# Patient Record
Sex: Female | Born: 1935 | Race: White | Hispanic: No | Marital: Single | Smoking: Never smoker
Health system: Southern US, Community
[De-identification: ages and names within clinical notes are randomized; demographics above are authoritative.]

## PROBLEM LIST (undated history)

## (undated) DIAGNOSIS — I10 Essential (primary) hypertension: Secondary | ICD-10-CM

## (undated) DIAGNOSIS — I639 Cerebral infarction, unspecified: Secondary | ICD-10-CM

## (undated) DIAGNOSIS — E876 Hypokalemia: Secondary | ICD-10-CM

## (undated) DIAGNOSIS — E785 Hyperlipidemia, unspecified: Secondary | ICD-10-CM

## (undated) HISTORY — DX: Hyperlipidemia, unspecified: E78.5

## (undated) HISTORY — DX: Cerebral infarction, unspecified: I63.9

## (undated) HISTORY — DX: Hypokalemia: E87.6

## (undated) HISTORY — PX: ABDOMINAL HYSTERECTOMY: SHX81

## (undated) HISTORY — DX: Essential (primary) hypertension: I10

---

## 1998-11-27 ENCOUNTER — Other Ambulatory Visit: Admission: RE | Admit: 1998-11-27 | Discharge: 1998-11-27 | Payer: Self-pay | Admitting: *Deleted

## 1999-12-02 ENCOUNTER — Encounter: Admission: RE | Admit: 1999-12-02 | Discharge: 1999-12-02 | Payer: Self-pay | Admitting: *Deleted

## 1999-12-02 ENCOUNTER — Encounter: Payer: Self-pay | Admitting: *Deleted

## 2002-09-12 ENCOUNTER — Encounter: Payer: Self-pay | Admitting: *Deleted

## 2002-09-12 ENCOUNTER — Encounter: Admission: RE | Admit: 2002-09-12 | Discharge: 2002-09-12 | Payer: Self-pay | Admitting: *Deleted

## 2009-01-01 ENCOUNTER — Encounter: Admission: RE | Admit: 2009-01-01 | Discharge: 2009-01-01 | Payer: Self-pay | Admitting: Family Medicine

## 2009-07-23 ENCOUNTER — Encounter: Admission: RE | Admit: 2009-07-23 | Discharge: 2009-07-23 | Payer: Self-pay | Admitting: Family Medicine

## 2011-02-07 ENCOUNTER — Other Ambulatory Visit: Payer: Self-pay | Admitting: Family Medicine

## 2011-02-07 DIAGNOSIS — R42 Dizziness and giddiness: Secondary | ICD-10-CM

## 2011-02-12 ENCOUNTER — Ambulatory Visit
Admission: RE | Admit: 2011-02-12 | Discharge: 2011-02-12 | Disposition: A | Discharge status: Home / Self Care | Payer: 59 | Source: Ambulatory Visit | Attending: Family Medicine | Admitting: Family Medicine

## 2011-02-12 DIAGNOSIS — R42 Dizziness and giddiness: Secondary | ICD-10-CM

## 2011-02-12 MED ORDER — GADOBENATE DIMEGLUMINE 529 MG/ML IV SOLN
10.0000 mL | Freq: Once | INTRAVENOUS | Status: AC | PRN
  Administered 2011-02-12: 10 mL via INTRAVENOUS

## 2012-12-10 ENCOUNTER — Other Ambulatory Visit: Payer: Self-pay | Admitting: Family Medicine

## 2012-12-10 ENCOUNTER — Ambulatory Visit
Admission: RE | Admit: 2012-12-10 | Discharge: 2012-12-10 | Disposition: A | Discharge status: Home / Self Care | Payer: Medicare Other | Source: Ambulatory Visit | Attending: Family Medicine | Admitting: Family Medicine

## 2012-12-10 DIAGNOSIS — M79662 Pain in left lower leg: Secondary | ICD-10-CM

## 2013-05-16 ENCOUNTER — Other Ambulatory Visit (HOSPITAL_COMMUNITY): Payer: Self-pay | Admitting: Family Medicine

## 2013-05-16 DIAGNOSIS — R011 Cardiac murmur, unspecified: Secondary | ICD-10-CM

## 2013-05-28 ENCOUNTER — Ambulatory Visit (HOSPITAL_COMMUNITY)
Admission: RE | Admit: 2013-05-28 | Discharge: 2013-05-28 | Disposition: A | Discharge status: Home / Self Care | Payer: Medicare Other | Source: Ambulatory Visit | Attending: Internal Medicine | Admitting: Internal Medicine

## 2013-05-28 DIAGNOSIS — R011 Cardiac murmur, unspecified: Secondary | ICD-10-CM | POA: Insufficient documentation

## 2013-05-28 DIAGNOSIS — I359 Nonrheumatic aortic valve disorder, unspecified: Secondary | ICD-10-CM

## 2013-05-28 NOTE — Progress Notes (Signed)
2D Echo Performed 05/28/2013    Alexandra Mckee, RCS  

## 2013-07-30 ENCOUNTER — Institutional Professional Consult (permissible substitution): Payer: Medicare Other | Admitting: Internal Medicine

## 2013-08-01 ENCOUNTER — Emergency Department (HOSPITAL_COMMUNITY)
Admission: EM | Admit: 2013-08-01 | Discharge: 2013-08-01 | Disposition: A | Discharge status: Home / Self Care | Payer: Medicare Other | Attending: Emergency Medicine | Admitting: Emergency Medicine

## 2013-08-01 ENCOUNTER — Emergency Department (HOSPITAL_COMMUNITY): Payer: Medicare Other

## 2013-08-01 DIAGNOSIS — Z7982 Long term (current) use of aspirin: Secondary | ICD-10-CM | POA: Insufficient documentation

## 2013-08-01 DIAGNOSIS — E876 Hypokalemia: Secondary | ICD-10-CM | POA: Insufficient documentation

## 2013-08-01 DIAGNOSIS — F29 Unspecified psychosis not due to a substance or known physiological condition: Secondary | ICD-10-CM | POA: Insufficient documentation

## 2013-08-01 DIAGNOSIS — R4182 Altered mental status, unspecified: Secondary | ICD-10-CM | POA: Insufficient documentation

## 2013-08-01 DIAGNOSIS — IMO0002 Reserved for concepts with insufficient information to code with codable children: Secondary | ICD-10-CM | POA: Insufficient documentation

## 2013-08-01 DIAGNOSIS — Z79899 Other long term (current) drug therapy: Secondary | ICD-10-CM | POA: Insufficient documentation

## 2013-08-01 LAB — I-STAT TROPONIN, ED: Troponin i, poc: 0.02 ng/mL (ref 0.00–0.08)

## 2013-08-01 LAB — CBC WITH DIFFERENTIAL/PLATELET
Basophils Absolute: 0 10*3/uL (ref 0.0–0.1)
Basophils Relative: 0 % (ref 0–1)
EOS PCT: 1 % (ref 0–5)
Eosinophils Absolute: 0.1 10*3/uL (ref 0.0–0.7)
HEMATOCRIT: 37.2 % (ref 36.0–46.0)
Hemoglobin: 13 g/dL (ref 12.0–15.0)
LYMPHS ABS: 1.7 10*3/uL (ref 0.7–4.0)
LYMPHS PCT: 26 % (ref 12–46)
MCH: 31.2 pg (ref 26.0–34.0)
MCHC: 34.9 g/dL (ref 30.0–36.0)
MCV: 89.2 fL (ref 78.0–100.0)
MONO ABS: 0.7 10*3/uL (ref 0.1–1.0)
MONOS PCT: 10 % (ref 3–12)
Neutro Abs: 4.2 10*3/uL (ref 1.7–7.7)
Neutrophils Relative %: 63 % (ref 43–77)
Platelets: 221 10*3/uL (ref 150–400)
RBC: 4.17 MIL/uL (ref 3.87–5.11)
RDW: 12.2 % (ref 11.5–15.5)
WBC: 6.6 10*3/uL (ref 4.0–10.5)

## 2013-08-01 LAB — URINALYSIS, ROUTINE W REFLEX MICROSCOPIC
BILIRUBIN URINE: NEGATIVE
GLUCOSE, UA: NEGATIVE mg/dL
KETONES UR: NEGATIVE mg/dL
Nitrite: NEGATIVE
PH: 6.5 (ref 5.0–8.0)
Protein, ur: NEGATIVE mg/dL
Specific Gravity, Urine: 1.009 (ref 1.005–1.030)
Urobilinogen, UA: 1 mg/dL (ref 0.0–1.0)

## 2013-08-01 LAB — PROTIME-INR
INR: 0.85 (ref 0.00–1.49)
PROTHROMBIN TIME: 11.5 s — AB (ref 11.6–15.2)

## 2013-08-01 LAB — COMPREHENSIVE METABOLIC PANEL
ALT: 25 U/L (ref 0–35)
AST: 34 U/L (ref 0–37)
Albumin: 3.8 g/dL (ref 3.5–5.2)
Alkaline Phosphatase: 78 U/L (ref 39–117)
BUN: 18 mg/dL (ref 6–23)
CHLORIDE: 93 meq/L — AB (ref 96–112)
CO2: 30 meq/L (ref 19–32)
CREATININE: 0.73 mg/dL (ref 0.50–1.10)
Calcium: 9.9 mg/dL (ref 8.4–10.5)
GFR calc Af Amer: 85 mL/min — ABNORMAL LOW (ref >90)
GFR, EST NON AFRICAN AMERICAN: 73 mL/min — AB (ref >90)
Glucose, Bld: 170 mg/dL — ABNORMAL HIGH (ref 70–99)
Potassium: 2.8 mEq/L — CL (ref 3.7–5.3)
Sodium: 137 mEq/L (ref 137–147)
Total Protein: 7.2 g/dL (ref 6.0–8.3)

## 2013-08-01 LAB — PRO B NATRIURETIC PEPTIDE: Pro B Natriuretic peptide (BNP): 279.3 pg/mL (ref 0–450)

## 2013-08-01 LAB — LIPASE, BLOOD: LIPASE: 66 U/L — AB (ref 11–59)

## 2013-08-01 LAB — POTASSIUM: Potassium: 2.8 mEq/L — CL (ref 3.7–5.3)

## 2013-08-01 LAB — URINE MICROSCOPIC-ADD ON

## 2013-08-01 MED ORDER — SODIUM CHLORIDE 0.9 % IV SOLN: INTRAVENOUS | Status: DC

## 2013-08-01 MED ORDER — POTASSIUM CHLORIDE CRYS ER 20 MEQ PO TBCR
40.0000 meq | EXTENDED_RELEASE_TABLET | Freq: Once | ORAL | Status: AC
  Administered 2013-08-01: 40 meq via ORAL
  Filled 2013-08-01: qty 2

## 2013-08-01 MED ORDER — POTASSIUM CHLORIDE ER 10 MEQ PO TBCR: 10.0000 meq | EXTENDED_RELEASE_TABLET | Freq: Two times a day (BID) | ORAL | Status: AC

## 2013-08-01 MED ORDER — SODIUM CHLORIDE 0.9 % IV BOLUS (SEPSIS)
250.0000 mL | Freq: Once | INTRAVENOUS | Status: AC
  Administered 2013-08-01: 1000 mL via INTRAVENOUS

## 2013-08-01 NOTE — ED Notes (Signed)
Deretha Emory, MD aware of pt's critical potassium.

## 2013-08-01 NOTE — Discharge Instructions (Signed)
Hypokalemia Hypokalemia means that the amount of potassium in the blood is lower than normal.Potassium is a chemical, called an electrolyte, that helps regulate the amount of fluid in the body. It also stimulates muscle contraction and helps nerves function properly.Most of the body's potassium is inside of cells, and only a very small amount is in the blood. Because the amount in the blood is so small, minor changes can be life-threatening. CAUSES  Antibiotics.  Diarrhea or vomiting.  Using laxatives too much, which can cause diarrhea.  Chronic kidney disease.  Water pills (diuretics).  Eating disorders (bulimia).  Low magnesium level.  Sweating a lot. SIGNS AND SYMPTOMS  Weakness.  Constipation.  Fatigue.  Muscle cramps.  Mental confusion.  Skipped heartbeats or irregular heartbeat (palpitations).  Tingling or numbness. DIAGNOSIS  Your health care provider can diagnose hypokalemia with blood tests. In addition to checking your potassium level, your health care provider may also check other lab tests. TREATMENT Hypokalemia can be treated with potassium supplements taken by mouth or adjustments in your current medicines. If your potassium level is very low, you may need to get potassium through a vein (IV) and be monitored in the hospital. A diet high in potassium is also helpful. Foods high in potassium are:  Nuts, such as peanuts and pistachios.  Seeds, such as sunflower seeds and pumpkin seeds.  Peas, lentils, and lima beans.  Whole grain and bran cereals and breads.  Fresh fruit and vegetables, such as apricots, avocado, bananas, cantaloupe, kiwi, oranges, tomatoes, asparagus, and potatoes.  Orange and tomato juices.  Red meats.  Fruit yogurt. HOME CARE INSTRUCTIONS  Take all medicines as prescribed by your health care provider.  Maintain a healthy diet by including nutritious food, such as fruits, vegetables, nuts, whole grains, and lean meats.  If  you are taking a laxative, be sure to follow the directions on the label. SEEK MEDICAL CARE IF:  Your weakness gets worse.  You feel your heart pounding or racing.  You are vomiting or having diarrhea.  You are diabetic and having trouble keeping your blood glucose in the normal range. SEEK IMMEDIATE MEDICAL CARE IF:  You have chest pain, shortness of breath, or dizziness.  You are vomiting or having diarrhea for more than 2 days.  You faint. MAKE SURE YOU:   Understand these instructions.  Will watch your condition.  Will get help right away if you are not doing well or get worse. Document Released: 02/07/2005 Document Revised: 11/28/2012 Document Reviewed: 08/10/2012 [facility] Patient Information 2014 [place], [place].  Continue taking the potassium supplement as directed. Prescription provided. Would recommend followup with your record Dr. to have your potassium rechecked next week. Also verify that you're getting your nighttime medicine on a regular basis and we'll see if that provided some improvement in your mental status. If not additional medication or new medication may be required. Return for any new or worse symptoms.

## 2013-08-01 NOTE — ED Provider Notes (Signed)
CSN: [id number]     Arrival date & time 08/01/13  1638 History   First MD Initiated Contact with Patient 08/01/13 1654     Chief Complaint  Patient presents with  . Altered Mental Status     (Consider location/radiation/quality/duration/timing/severity/associated sxs/prior Treatment) Patient is a 80 y.o. female presenting with altered mental status. The history is provided by the patient.  Altered Mental Status Presenting symptoms: confusion   Associated symptoms: agitation   Associated symptoms: no abdominal pain, no fever, no headaches, no nausea, no rash, no vomiting and no weakness    Patient brought in by family. Patient lives by herself but daughter is nearby and checks on her frequently. They've noted a change in her mental status more of like when she wasn't taking her antidepressant. Patient not been as well patient not quite as alert patient rubbing her hands frequently. No fever no nausea vomiting no complaints of chest pain headache or belly pain. They think that she may have not been taking her antidepressant I started to give it to her all under fair control every evening so they know she's had for the last 3 days. Several things that happened in the past but she stopped taking it. Patient's only other medication is hydrochlorothiazide.  No past medical history on file. No past surgical history on file. No family history on file. History  Substance Use Topics  . Smoking status: Not on file  . Smokeless tobacco: Not on file  . Alcohol Use: Not on file   OB History   No data available     Review of Systems  Constitutional: Negative for fever.  HENT: Negative for congestion.   Eyes: Negative for visual disturbance.  Respiratory: Negative for shortness of breath.   Cardiovascular: Negative for chest pain.  Gastrointestinal: Negative for nausea, vomiting and abdominal pain.  Genitourinary: Negative for dysuria.  Musculoskeletal: Negative for back pain.  Skin: Negative  for rash.  Neurological: Negative for speech difficulty, weakness and headaches.  Hematological: Does not bruise/bleed easily.  Psychiatric/Behavioral: Positive for confusion and agitation.      Allergies  Review of patient's allergies indicates no known allergies.  Home Medications   Prior to Admission medications   Medication Sig Start Date End Date Taking? Authorizing Provider  aspirin 81 MG tablet Take 81 mg by mouth daily.   Yes Historical Provider, MD  hydrochlorothiazide (HYDRODIURIL) 25 MG tablet Take 25 mg by mouth daily.   Yes Historical Provider, MD  mirtazapine (REMERON) 15 MG tablet Take 15 mg by mouth at bedtime.   Yes Historical Provider, MD   BP 182/66  Pulse 85  Temp(Src) 98.1 F (36.7 C) (Oral)  Resp 13  SpO2 92% Physical Exam  Nursing note and vitals reviewed. Constitutional: She appears well-developed and well-nourished. No distress.  HENT:  Head: Normocephalic and atraumatic.  Mucous membranes slightly dry.  Eyes: Conjunctivae and EOM are normal. Pupils are equal, round, and reactive to light.  Cardiovascular: Normal rate, regular rhythm and normal heart sounds.   No murmur heard. Pulmonary/Chest: Effort normal and breath sounds normal. No respiratory distress.  Abdominal: Soft. Bowel sounds are normal. There is no tenderness.  Musculoskeletal: Normal range of motion. She exhibits no edema and no tenderness.  Neurological: She is alert. No cranial nerve deficit. She exhibits normal muscle tone. Coordination normal.  Skin: Skin is warm. No rash noted.    ED Course  Procedures (including critical care time) Labs Review Labs Reviewed  URINALYSIS, ROUTINE W REFLEX  MICROSCOPIC - Abnormal; Notable for the following:    APPearance CLOUDY (*)    Hgb urine dipstick SMALL (*)    Leukocytes, UA TRACE (*)    All other components within normal limits  COMPREHENSIVE METABOLIC PANEL - Abnormal; Notable for the following:    Potassium 2.8 (*)    Chloride 93  (*)    Glucose, Bld 170 (*)    Total Bilirubin <0.2 (*)    GFR calc non Af Amer 73 (*)    GFR calc Af Amer 85 (*)    All other components within normal limits  LIPASE, BLOOD - Abnormal; Notable for the following:    Lipase 66 (*)    All other components within normal limits  PROTIME-INR - Abnormal; Notable for the following:    Prothrombin Time 11.5 (*)    All other components within normal limits  POTASSIUM - Abnormal; Notable for the following:    Potassium 2.8 (*)    All other components within normal limits  CBC WITH DIFFERENTIAL  PRO B NATRIURETIC PEPTIDE  URINE MICROSCOPIC-ADD ON  Rosezena SensorI-STAT TROPOININ, ED    Imaging Review Dg Chest 2 View  08/01/2013   CLINICAL DATA:  Shortness of breath  EXAM: CHEST  2 VIEW  COMPARISON:  None.  FINDINGS: The heart size and mediastinal contours are within normal limits. Both lungs are clear. The visualized skeletal structures are unremarkable.  IMPRESSION: No active cardiopulmonary disease.   Electronically Signed   By: Alcide CleverMark  Lukens M.D.   On: 08/01/2013 18:46   Ct Head Wo Contrast  08/01/2013   CLINICAL DATA:  Altered mental status for 1 week. Atrial fibrillation.  EXAM: CT HEAD WITHOUT CONTRAST  TECHNIQUE: Contiguous axial images were obtained from the base of the skull through the vertex without contrast.  COMPARISON:  MR brain 02/12/2011.  FINDINGS: No evidence for acute infarction, hemorrhage, mass lesion, hydrocephalus, or extra-axial fluid. Mild atrophy appropriate for age. Moderately advanced chronic microvascular ischemic change. No CT signs of proximal vascular thrombosis. Vascular calcification. Calvarium intact. No sinus air-fluid level or mastoid inflammation. Chronic changes as described. Right cataract extraction.  IMPRESSION: No acute intracranial abnormality.  Similar appearance to prior MR.   Electronically Signed   By: Davonna BellingJohn  Curnes M.D.   On: 08/01/2013 19:21     EKG Interpretation   Date/Time:  Thursday August 01 2013 16:41:18  EDT Ventricular Rate:  93 PR Interval:  216 QRS Duration: 105 QT Interval:  388 QTC Calculation: 483 R Axis:   99 Text Interpretation:  Sinus rhythm Prolonged PR interval Consider left  ventricular hypertrophy Inferior infarct, age indeterminate Baseline  wander in lead(s) I II aVR No previous ECGs available Confirmed by  Sallye Lunz  MD, Jahayra Mazo [phone]([id number]) on 08/01/2013 5:11:25 PM      MDM   Final diagnoses:  Hypokalemia  Altered mental status    Patient's mental status is more just kind of a spacing out not wanting to be rubbing her hands together family noted this happen before when she stopped taking her antidepressant. They have not been able to verify that she was taking on a regular basis but they started to keep track of it for the past 3 days and no she's gotten that. Patient also on hydrochlorothiazide for her blood pressure. That's probably the cause of the hypokalemia. The patient potassium was 2.8. Patient given 40 mg of potassium here x2 in between potassium was rechecked just to make sure that that was accurate was still 2.8. Discussed  with hospitalist patient does have a primary care Dr. can be discharged home continuing potassium supplements.  Patient's renal function is normal. Patient's EKG had no acute changes. Rest the patient's workup was negative. No evidence urinary tract infection no evidence of pneumonia head CT without any acute changes. Patient alert cooperative here. We'll discharge home on potassium supplements. She'll followup with her regular doctor first part of next week. Family will verify that she gets her antidepressant. They will reassess and see if additional medication or new medication is required. They will return for any newer worse symptoms.    Vanetta Mulders, MD 08/01/13 2216

## 2013-08-01 NOTE — ED Notes (Signed)
Family noted patient had worensing altered mental status for one week. Brought in by ems, ekg in transporter was a fib no noted cardiac hx,

## 2013-08-28 ENCOUNTER — Encounter: Payer: Self-pay | Admitting: Neurology

## 2013-08-28 ENCOUNTER — Telehealth: Payer: Self-pay | Admitting: Neurology

## 2013-08-28 ENCOUNTER — Ambulatory Visit (INDEPENDENT_AMBULATORY_CARE_PROVIDER_SITE_OTHER): Payer: Medicare Other | Admitting: Neurology

## 2013-08-28 VITALS — BP 128/70 | HR 74 | Resp 16 | Ht 64.25 in | Wt 131.5 lb

## 2013-08-28 DIAGNOSIS — R413 Other amnesia: Secondary | ICD-10-CM

## 2013-08-28 MED ORDER — DONEPEZIL HCL 10 MG PO TABS: ORAL_TABLET | ORAL | Status: DC

## 2013-08-28 NOTE — Progress Notes (Signed)
NEUROLOGY CONSULTATION NOTE  Alexandra Mckee MRN: [id number] DOB: [birth year]  Referring provider: Dr. Tally Joe Primary care provider: Dr. Tally Joe  Reason for consult:  dementia  Dear Dr Azucena Cecil:  Thank you for your kind referral of Alexandra Mckee for consultation of the above symptoms. Although her history is well known to you, please allow me to reiterate it for the purpose of our medical record. The patient was accompanied to the clinic by her daughter and caregiver/family friend who also provides collateral information. Records and images were personally reviewed where available.  HISTORY OF PRESENT ILLNESS: This is a pleasant 80 year old right-handed woman with a history of hypertension, hyperlipidemia, prediabetes, presenting for evaluation of memory loss.  According to the patient, she feels her memory is "okay, with some memory problems."  Her daughter reports that they started to become concerned a year ago while they were on vacation and the patient called her to say that "she would not be there" when they got back.  She had lost 13lbs over 2 weeks and was diagnosed with depression and started on Remeron.  Her daughter's family came to live with her for 8-9 months, and noticed that the patient became dependent on her and would not do things by herself anymore.  She would ask them to prepare her food, and would need prompting to do things. They were concerned she would not initiate things anymore, when in the past she was very independent and did everything by herself.  Family moved out 2 months ago, and the patient has had a caregiver friend staying with her.  They are concerned because she can't make a decision for herself anymore. She would become scared that the water was not going to get hot for her bath, then once in the tub, she is afraid of getting out and falling.  She has to be instructed to go to the bathroom, and she states she can't go, even if she is already sitting on the  toilet.  She has always been a "worrier" but gets anxious more easily now. She gets very agitated when they would leave the house, asking where they are going.  She states she "just doesn't feel right, I don't understand what is happening."  She sleeps well at night, but continues to have daytime drowsiness taking naps during the day.  Her daughter does endorse that she asks the same questions repeatedly.  She started needing help with writing out her checks last year, and her daughter took this over 6 months ago after she paid a bill twice.  She was brought to the ER last month after she was noted to have confusion.  Her family came to see her outside the house with her pocketbook and keys, stating she was going somewhere else because she had not paid the homeowner fees. Her BP was noted to be elevated, she was brought to [facility] ER where CBC and CMP were unremarkable except for low potassium.  Urinalysis, chest xray, and head CT were unremarkable.   She denies any headaches, dizziness, diplopia, dysarthria, dysphagia, neck pain, focal numbness/tingling/weakness, bowel/bladder incontinence.  She has occasional back pain.  Her 2 sisters (ages 57 and 99) have been diagnosed with dementia.    I personally reviewed head CT without contrast done 08/01/13 which showed mild global atrophy and moderately advanced chronic microvascular ischemic changes bilaterally.  Laboratory Data: Lab Results  Component Value Date   WBC 6.6 08/01/2013   HGB 13.0 08/01/2013  HCT 37.2 08/01/2013   MCV 89.2 08/01/2013   PLT 221 08/01/2013     Chemistry      Component Value Date/Time   NA 137 08/01/2013 1740   K 2.8* 08/01/2013 2045   CL 93* 08/01/2013 1740   CO2 30 08/01/2013 1740   BUN 18 08/01/2013 1740   CREATININE 0.73 08/01/2013 1740      Component Value Date/Time   CALCIUM 9.9 08/01/2013 1740   ALKPHOS 78 08/01/2013 1740   AST 34 08/01/2013 1740   ALT 25 08/01/2013 1740   BILITOT <0.2* 08/01/2013 1740      PAST MEDICAL  HISTORY: Past Medical History  Diagnosis Date  . Hyperlipidemia   . Hypertension   . Hypokalemia     PAST SURGICAL HISTORY: Past Surgical History  Procedure Laterality Date  . Abdominal hysterectomy      MEDICATIONS: Current Outpatient Prescriptions on File Prior to Visit  Medication Sig Dispense Refill  . hydrochlorothiazide (HYDRODIURIL) 25 MG tablet Take 25 mg by mouth daily.      . mirtazapine (REMERON) 15 MG tablet Take 15 mg by mouth at bedtime.      . potassium chloride (K-DUR) 10 MEQ tablet Take 1 tablet (10 mEq total) by mouth 2 (two) times daily.  10 tablet  0  . aspirin 81 MG tablet Take 81 mg by mouth daily.       No current facility-administered medications on file prior to visit.    ALLERGIES: No Known Allergies  FAMILY HISTORY: Family History  Problem Relation Age of Onset  . Heart attack Father     SOCIAL HISTORY: History   Social History  . Marital Status: Single    Spouse Name: N/A    Number of Children: N/A  . Years of Education: N/A   Occupational History  . Not on file.   Social History Main Topics  . Smoking status: Never Smoker   . Smokeless tobacco: Not on file  . Alcohol Use: No  . Drug Use: No  . Sexual Activity: Not on file   Other Topics Concern  . Not on file   Social History Narrative  . No narrative on file    REVIEW OF SYSTEMS: Constitutional: No fevers, chills, or sweats, no generalized fatigue, change in appetite Eyes: No visual changes, double vision, eye pain Ear, nose and throat: No hearing loss, ear pain, nasal congestion, sore throat Cardiovascular: No chest pain, palpitations Respiratory:  No shortness of breath at rest or with exertion, wheezes GastrointestinaI: No nausea, vomiting, diarrhea, abdominal pain, fecal incontinence Genitourinary:  No dysuria, urinary retention or frequency Musculoskeletal:  No neck pain, occl back pain Integumentary: No rash, pruritus, skin lesions Neurological: as  above Psychiatric: + depression, no insomnia,+ anxiety Endocrine: No palpitations, fatigue, diaphoresis, mood swings, change in appetite, change in weight, increased thirst Hematologic/Lymphatic:  No anemia, purpura, petechiae. Allergic/Immunologic: no itchy/runny eyes, nasal congestion, recent allergic reactions, rashes  PHYSICAL EXAM: Filed Vitals:   08/28/13 1034  BP: 128/70  Pulse: 74  Resp: 16   General: No acute distress Head:  Normocephalic/atraumatic Eyes: Fundoscopic exam shows bilateral sharp discs, no vessel changes, exudates, or hemorrhages Neck: supple, no paraspinal tenderness, full range of motion Back: No paraspinal tenderness Heart: regular rate and rhythm Lungs: Clear to auscultation bilaterally. Vascular: No carotid bruits. Skin/Extremities: No rash, no edema Neurological Exam: Mental status: alert and oriented to person, place, and time, no dysarthria or aphasia, Fund of knowledge is appropriate.  Remote memory are  intact.  Attention and concentration are normal.    Able to name objects and repeat phrases. MMSE 26/30 (missed 1 point for date, 0/3 delayed recall) Cranial nerves: CN I: not tested CN II: pupils equal, round and reactive to light, visual fields intact, fundi unremarkable. CN III, IV, VI:  full range of motion, no nystagmus, no ptosis CN V: facial sensation intact CN VII: upper and lower face symmetric CN VIII: hearing intact to finger rub CN IX, X: gag intact, uvula midline CN XI: sternocleidomastoid and trapezius muscles intact CN XII: tongue midline Bulk & Tone: normal, no fasciculations. Motor: 5/5 throughout with no pronator drift. Sensation: intact to light touch, cold, pin, vibration and joint position sense.  No extinction to double simultaneous stimulation.  Romberg test negative Deep Tendon Reflexes: +2 throughout except for absent ankle jerks bilaterally, no ankle clonus Plantar responses: downgoing bilaterally Cerebellar: no  incoordination on finger to nose, heel to shin. No dysdiadochokinesia Gait: narrow-based and steady, able to tandem walk adequately. Tremor: none  IMPRESSION: This is a pleasant 80 year old right-handed woman with a history of hypertension, hyperlipidemia, prediabetes, presenting with memory loss with some personality changes with increasing anxiety and agitation when asked to go to an unfamiliar environment.  Her family reports that she has become very dependent and unsure of herself.  MMSE 26/30, suggestive of mild cognitive impairment.  Her neurological exam is non-focal, head CT shows moderately advanced chronic microvascular disease, raising the possibility of vascular dementia. Depression commonly coexists, and may play a role as well.  She may benefit from starting cholinesterase inhibitors such as Aricept.  Side effects and expectations from the medication were discussed. We discussed the importance of monitoring mood, physical and brain stimulation exercises, and control of vascular risk factors for brain health.  We discussed the importance of long-term planning, family is looking into assisted living facilities, which I agree with.  She will follow-up in 6 months.  Thank you for allowing me to participate in the care of this patient. Please do not hesitate to call for any questions or concerns.   Patrcia DollyKaren Gillie Crisci, M.D.  CC: Dr. Azucena CecilSwayne

## 2013-08-28 NOTE — Telephone Encounter (Signed)
Can you pls check on generic donepezil?  THanks

## 2013-08-28 NOTE — Patient Instructions (Signed)
1. Start Aricept 10mg : Take 1/2 tab daily for 1 week, then increase to 1 tab daily 2. Physical and brain stimulation exercises are important for brain health 3. Agree with assisted living facility 4. Follow-up in 6 months

## 2013-08-28 NOTE — Telephone Encounter (Signed)
Pt states the ins will not pay for the medication and it was 240 dollars can you call the and find out what is approved by the ins or call in something else please daughters telephone number is [phone]

## 2013-08-28 NOTE — Telephone Encounter (Signed)
Is there something else she can take?

## 2013-08-29 NOTE — Telephone Encounter (Signed)
I called the pharmacy. The rx was ran under donepezil. The out of pocket price for the patient is $219.99. Is there a different med that you would like for me to check the price on for her?

## 2013-08-30 NOTE — Telephone Encounter (Signed)
After checking on the other 2 medications that were suggested as well as Namenda through Alexandra Mckee's pharmacy, which neither of these medications were covered when the pharmacy ran rx's through Alexandra Mckee's insurance. I also called [employer] medicare to see if I could get a list of drugs that would be covered for Alexandra Mckee's dx. I was informed that since Alexandra Mckee has medicare part b that she doesn't have a rx drug plan.   I did call Alexandra Mckee's daughter/Alexandra Mckee and made her aware of all of this. She wanted to know if Dr. Karel JarvisAquino had any other suggestions and if this medicine was something she felt like her mother had to be on. I told her I would speak with Dr. Karel JarvisAquino and call her back.  I spoke with Dr. Karel JarvisAquino about this and she states that this med isn't mandatory and that there is nothing we could do at this point since the medications aren't covered and are too expensive.  I called Alexandra Mckee back and relayed this information to her and she said if this is something that could possibly help her mother she would see if they could work something out. She is going to talk with her brother to see what his thoughts and if they decided to go with the medication she would call back to let Alexandra Mckee know.

## 2013-08-30 NOTE — Telephone Encounter (Signed)
Can you pls check on galantamine and rivastigmine? Thanks!

## 2013-08-31 ENCOUNTER — Encounter: Payer: Self-pay | Admitting: Neurology

## 2013-09-06 ENCOUNTER — Other Ambulatory Visit: Payer: Self-pay | Admitting: Infectious Disease

## 2013-09-06 ENCOUNTER — Ambulatory Visit
Admission: RE | Admit: 2013-09-06 | Discharge: 2013-09-06 | Disposition: A | Discharge status: Home / Self Care | Payer: No Typology Code available for payment source | Source: Ambulatory Visit | Attending: Infectious Disease | Admitting: Infectious Disease

## 2013-09-06 DIAGNOSIS — R7611 Nonspecific reaction to tuberculin skin test without active tuberculosis: Secondary | ICD-10-CM

## 2013-12-04 ENCOUNTER — Telehealth: Payer: Self-pay | Admitting: Neurology

## 2013-12-04 NOTE — Telephone Encounter (Signed)
appt canc per daughter's request, pt says she does not want to go further w/ med treatment at this time / Sherri S.

## 2013-12-23 ENCOUNTER — Encounter (HOSPITAL_COMMUNITY): Payer: Self-pay | Admitting: Emergency Medicine

## 2013-12-23 ENCOUNTER — Emergency Department (HOSPITAL_COMMUNITY)
Admission: EM | Admit: 2013-12-23 | Discharge: 2013-12-23 | Disposition: A | Discharge status: Home / Self Care | Payer: Medicare Other | Attending: Emergency Medicine | Admitting: Emergency Medicine

## 2013-12-23 DIAGNOSIS — Z8639 Personal history of other endocrine, nutritional and metabolic disease: Secondary | ICD-10-CM | POA: Insufficient documentation

## 2013-12-23 DIAGNOSIS — Z792 Long term (current) use of antibiotics: Secondary | ICD-10-CM | POA: Insufficient documentation

## 2013-12-23 DIAGNOSIS — I10 Essential (primary) hypertension: Secondary | ICD-10-CM | POA: Insufficient documentation

## 2013-12-23 DIAGNOSIS — Z7982 Long term (current) use of aspirin: Secondary | ICD-10-CM | POA: Diagnosis not present

## 2013-12-23 DIAGNOSIS — Z79899 Other long term (current) drug therapy: Secondary | ICD-10-CM | POA: Insufficient documentation

## 2013-12-23 DIAGNOSIS — R630 Anorexia: Secondary | ICD-10-CM | POA: Diagnosis present

## 2013-12-23 DIAGNOSIS — N390 Urinary tract infection, site not specified: Secondary | ICD-10-CM

## 2013-12-23 LAB — URINALYSIS, ROUTINE W REFLEX MICROSCOPIC
BILIRUBIN URINE: NEGATIVE
GLUCOSE, UA: NEGATIVE mg/dL
KETONES UR: NEGATIVE mg/dL
Nitrite: NEGATIVE
PROTEIN: NEGATIVE mg/dL
Specific Gravity, Urine: 1.023 (ref 1.005–1.030)
Urobilinogen, UA: 1 mg/dL (ref 0.0–1.0)
pH: 6 (ref 5.0–8.0)

## 2013-12-23 LAB — BASIC METABOLIC PANEL
Anion gap: 14 (ref 5–15)
BUN: 21 mg/dL (ref 6–23)
CHLORIDE: 106 meq/L (ref 96–112)
CO2: 25 meq/L (ref 19–32)
CREATININE: 0.76 mg/dL (ref 0.50–1.10)
Calcium: 9.4 mg/dL (ref 8.4–10.5)
GFR calc Af Amer: 84 mL/min — ABNORMAL LOW (ref >90)
GFR calc non Af Amer: 72 mL/min — ABNORMAL LOW (ref >90)
Glucose, Bld: 103 mg/dL — ABNORMAL HIGH (ref 70–99)
Potassium: 3.8 mEq/L (ref 3.7–5.3)
Sodium: 145 mEq/L (ref 137–147)

## 2013-12-23 LAB — CBC WITH DIFFERENTIAL/PLATELET
BASOS ABS: 0 10*3/uL (ref 0.0–0.1)
BASOS PCT: 0 % (ref 0–1)
EOS ABS: 0.1 10*3/uL (ref 0.0–0.7)
EOS PCT: 1 % (ref 0–5)
HEMATOCRIT: 38.9 % (ref 36.0–46.0)
HEMOGLOBIN: 13 g/dL (ref 12.0–15.0)
Lymphocytes Relative: 27 % (ref 12–46)
Lymphs Abs: 2 10*3/uL (ref 0.7–4.0)
MCH: 30.7 pg (ref 26.0–34.0)
MCHC: 33.4 g/dL (ref 30.0–36.0)
MCV: 91.7 fL (ref 78.0–100.0)
MONO ABS: 0.6 10*3/uL (ref 0.1–1.0)
MONOS PCT: 9 % (ref 3–12)
Neutro Abs: 4.6 10*3/uL (ref 1.7–7.7)
Neutrophils Relative %: 63 % (ref 43–77)
Platelets: 225 10*3/uL (ref 150–400)
RBC: 4.24 MIL/uL (ref 3.87–5.11)
RDW: 12.2 % (ref 11.5–15.5)
WBC: 7.3 10*3/uL (ref 4.0–10.5)

## 2013-12-23 LAB — URINE MICROSCOPIC-ADD ON

## 2013-12-23 MED ORDER — FOSFOMYCIN TROMETHAMINE 3 G PO PACK
3.0000 g | PACK | Freq: Once | ORAL | Status: AC
  Administered 2013-12-23: 3 g via ORAL
  Filled 2013-12-23 (×2): qty 3

## 2013-12-23 NOTE — ED Notes (Signed)
MD at bedside. 

## 2013-12-23 NOTE — ED Provider Notes (Signed)
CSN: [id number]     Arrival date & time 12/23/13  1327 History   First MD Initiated Contact with Patient 12/23/13 1344     Chief Complaint  Patient presents with  . Anorexia  . Fatigue     (Consider location/radiation/quality/duration/timing/severity/associated sxs/prior Treatment) HPI Patient presents with her daughter who provides the history of present illness.  Patient has dementia.  Level V caveat. Per report the patient was diagnosed with urinary tract infection 1 week ago.  Subsequently, the patient has had anorexia, decreased interactivity, increasing somnolence. No report of new fever, vomiting, falling. The patient herself denies pain, acknowledges feeling poorly, otherwise is essentially non-participatory in the exam.  Past Medical History  Diagnosis Date  . Hyperlipidemia   . Hypertension   . Hypokalemia    Past Surgical History  Procedure Laterality Date  . Abdominal hysterectomy     Family History  Problem Relation Age of Onset  . Heart attack Father    History  Substance Use Topics  . Smoking status: Never Smoker   . Smokeless tobacco: Not on file  . Alcohol Use: No   OB History    No data available     Review of Systems  Unable to perform ROS: Dementia      Allergies  Review of patient's allergies indicates no known allergies.  Home Medications   Prior to Admission medications   Medication Sig Start Date End Date Taking? Authorizing Provider  ciprofloxacin (CIPRO) 250 MG tablet Take 250 mg by mouth 2 (two) times daily.   Yes Historical Provider, MD  hydrochlorothiazide (HYDRODIURIL) 25 MG tablet Take 25 mg by mouth daily.   Yes Historical Provider, MD  mirtazapine (REMERON) 15 MG tablet Take 15 mg by mouth at bedtime.   Yes Historical Provider, MD  potassium chloride (K-DUR) 10 MEQ tablet Take 1 tablet (10 mEq total) by mouth 2 (two) times daily. 08/01/13  Yes Vanetta MuldersScott Zackowski, MD  aspirin 81 MG tablet Take 81 mg by mouth daily.    Historical  Provider, MD  donepezil (ARICEPT) 10 MG tablet Take 1/2 tab daily for 1 week, then increase to 1 tab daily 08/28/13   Van ClinesKaren M Aquino, MD  Multiple Vitamins-Minerals (MULTIVITAMIN WITH MINERALS) tablet Take 1 tablet by mouth daily.    Historical Provider, MD   BP 177/74 mmHg  Pulse 78  SpO2 97% Physical Exam  Constitutional: She is oriented to person, place, and time. She appears well-developed and well-nourished.  Non-toxic appearance. No distress.  HENT:  Head: Normocephalic and atraumatic.  Eyes: Conjunctivae and EOM are normal.  Cardiovascular: Normal rate and regular rhythm.   Pulmonary/Chest: Effort normal and breath sounds normal. No stridor. No respiratory distress.  Abdominal: She exhibits no distension.  Musculoskeletal: She exhibits no edema.  Neurological: She is alert and oriented to person, place, and time. No cranial nerve deficit.  Skin: Skin is warm and dry.  Psychiatric: She has a normal mood and affect.  Nursing note and vitals reviewed.   ED Course  Procedures (including critical care time) Labs Review Labs Reviewed  URINALYSIS, ROUTINE W REFLEX MICROSCOPIC - Abnormal; Notable for the following:    APPearance CLOUDY (*)    Hgb urine dipstick SMALL (*)    Leukocytes, UA MODERATE (*)    All other components within normal limits  BASIC METABOLIC PANEL - Abnormal; Notable for the following:    Glucose, Bld 103 (*)    GFR calc non Af Amer 72 (*)    GFR  calc Af Amer 84 (*)    All other components within normal limits  URINE MICROSCOPIC-ADD ON - Abnormal; Notable for the following:    Squamous Epithelial / LPF FEW (*)    All other components within normal limits  CBC WITH DIFFERENTIAL   I reviewed the EMR  Update: Patient in no distress  MDM  Patient presents with family members relate the history of present illness. Patient has recently diagnosed urinary tract infection, and currently family is concerned about decreased by mouth intake, decreased  interactivity. Patient's labs are largely reassuring, with no evidence for substantial electrolyte abnormalities, nor sepsis.    Gerhard Munchobert Demarlo Riojas, MD 12/23/13 [phone]

## 2013-12-23 NOTE — Discharge Instructions (Signed)
As discussed, your evaluation today has been largely reassuring.  But, it is important that you monitor your condition carefully, and do not hesitate to return to the ED if you develop new, or concerning changes in your condition. ? ?Otherwise, please follow-up with your physician for appropriate ongoing care. ? ?

## 2013-12-23 NOTE — ED Notes (Signed)
Pt's daughter states that [facility] and has been recently treated for UTI. Pt has had loss of appetite and not wanting to get out of bed.  Pt also been more anxious.  Pt is normally very active and going to all the activities at the facility.

## 2013-12-24 LAB — URINE CULTURE: SPECIAL REQUESTS: NORMAL

## 2014-02-28 ENCOUNTER — Ambulatory Visit: Payer: Medicare Other | Admitting: Neurology

## 2014-10-23 IMAGING — CT CT HEAD W/O CM
1 series · 16 of 30 positions shown, 20 images · non-contrast
Comparison: MR brain 02/12/2011.

CLINICAL DATA: Altered mental status for 1 week. Atrial
fibrillation.

EXAM:
CT HEAD WITHOUT CONTRAST
TECHNIQUE: Contiguous axial images were obtained from the base of the skull
through the vertex without contrast.

[Series 2: head 5.0 h30s · axial · 0.44mm/px · z∈[+1324,+1469]mm · 16 of 33 slices shown, 20 images]
[im 2/33  brain]
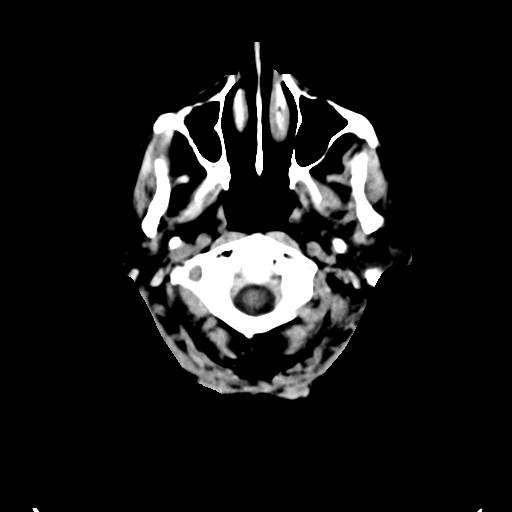
[im 2/33  bone]
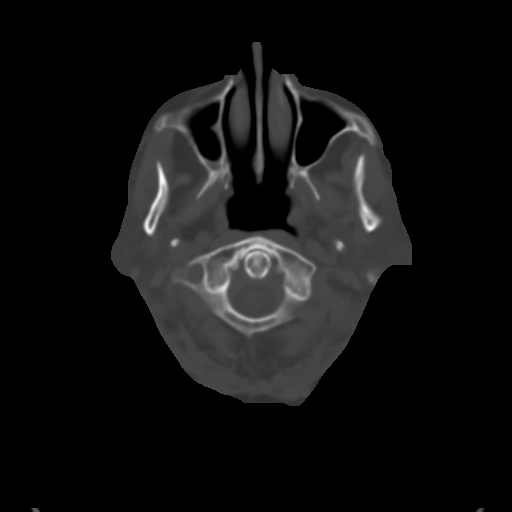
[im 4/33  brain]
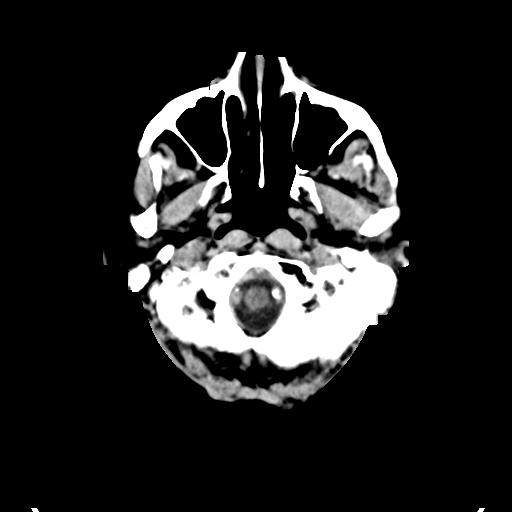
[im 6/33  brain]
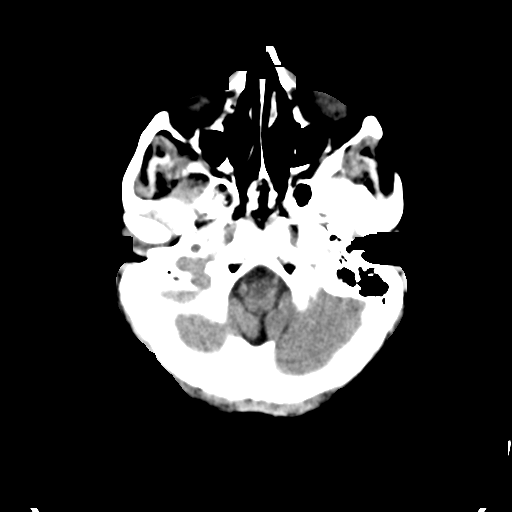
[im 8/33  brain]
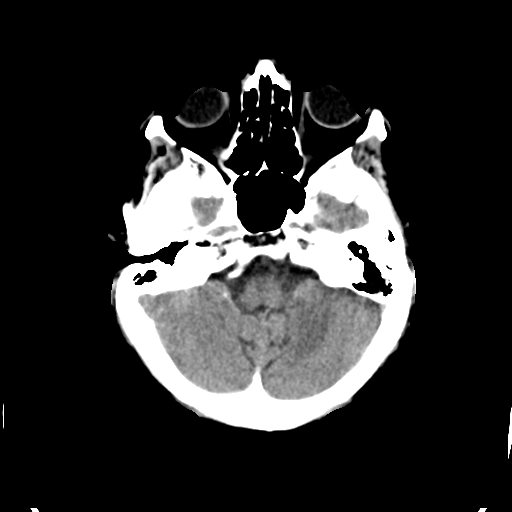
[im 9/33  brain]
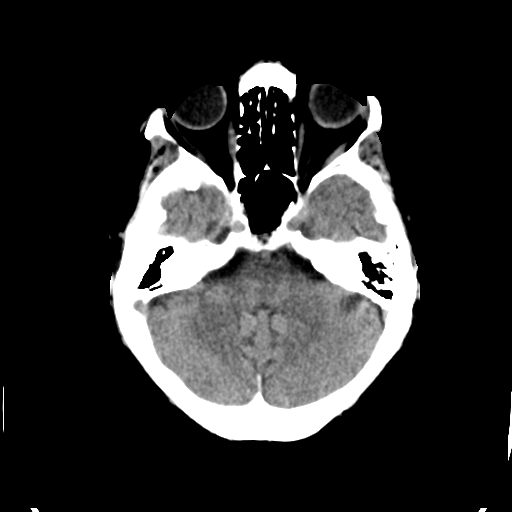
[im 9/33  bone]
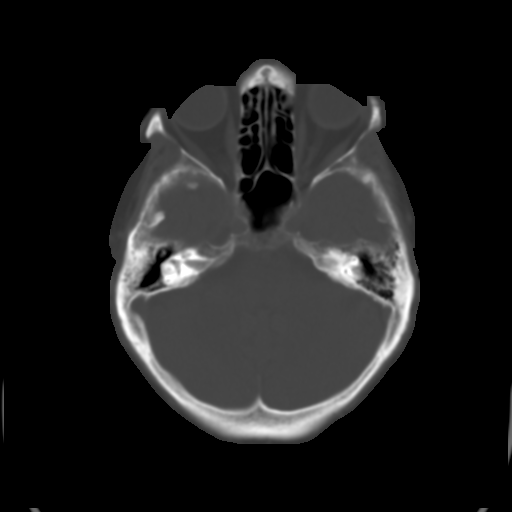
[im 12/33  brain]
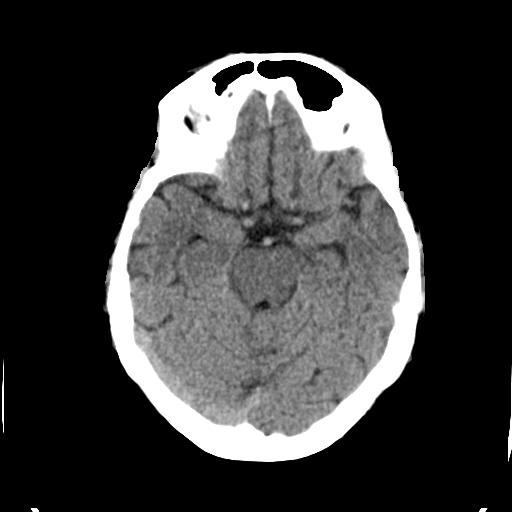
[im 14/33  brain]
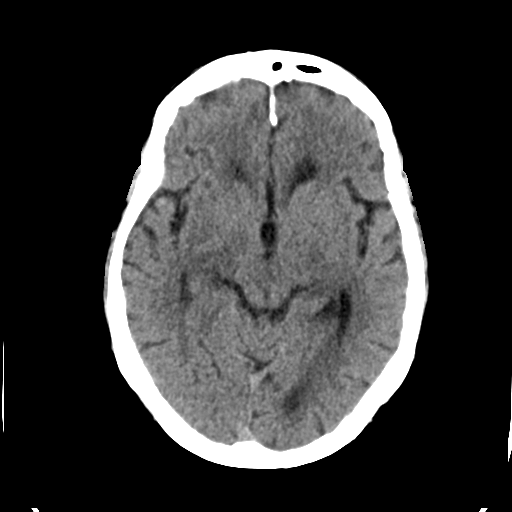
[im 16/33  brain]
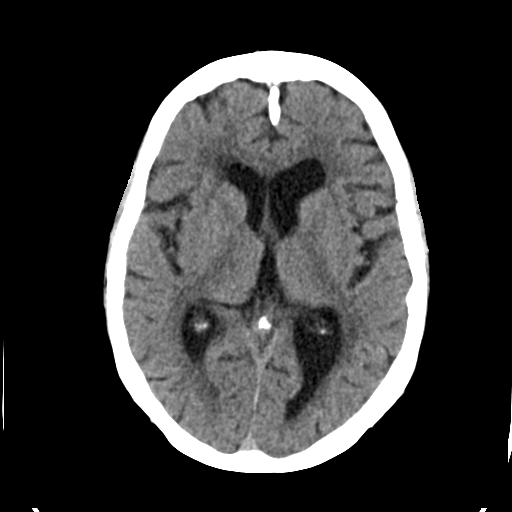
[im 17/33  brain]
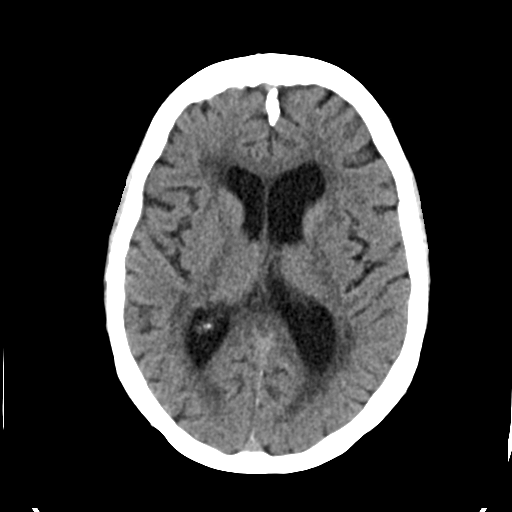
[im 17/33  bone]
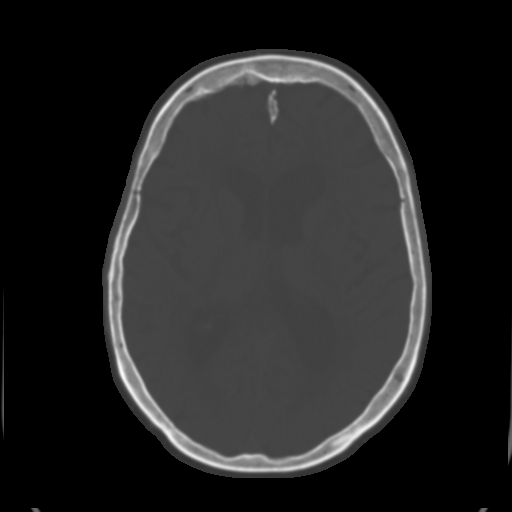
[im 19/33  brain]
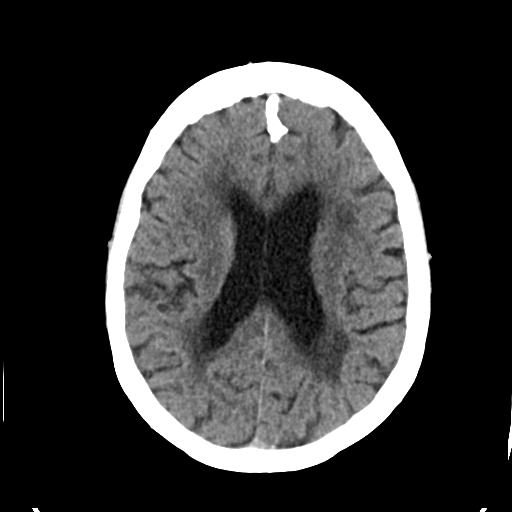
[im 21/33  brain]
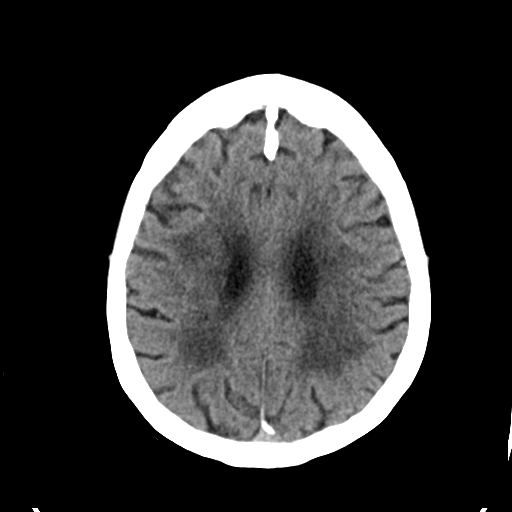
[im 24/33  brain]
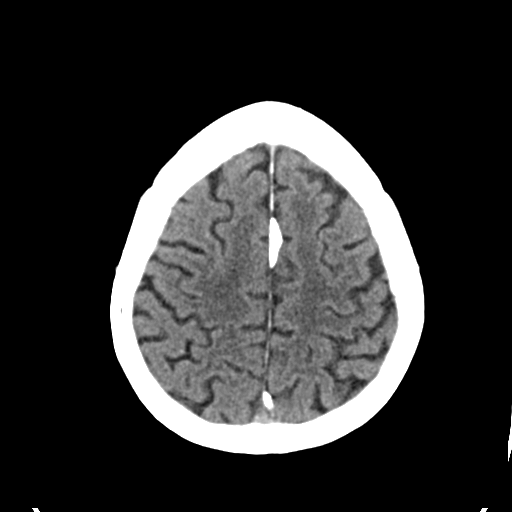
[im 25/33  brain]
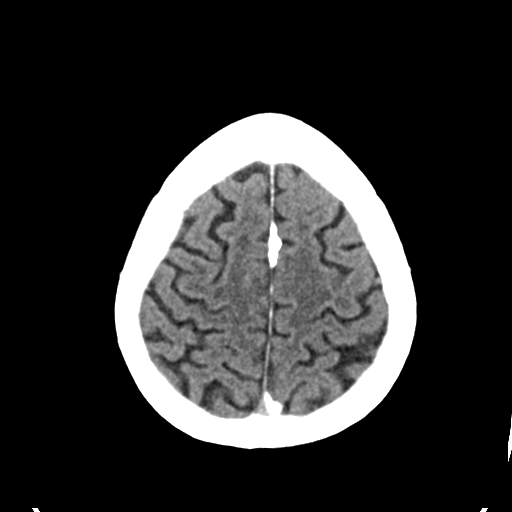
[im 25/33  bone]
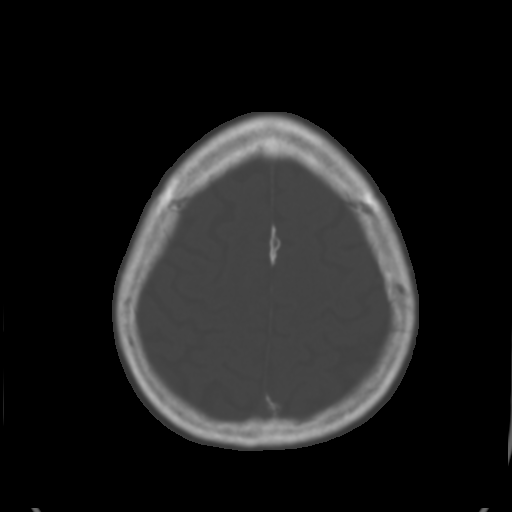
[im 27/33  brain]
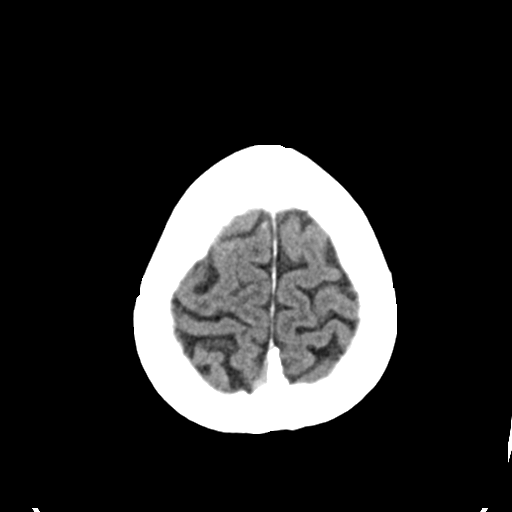
[im 29/33  brain]
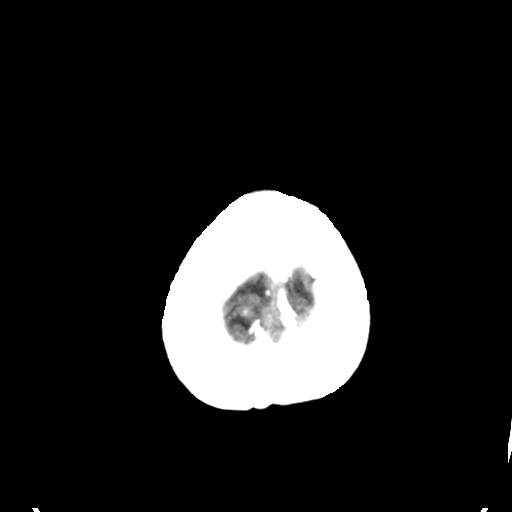
[im 31/33  brain]
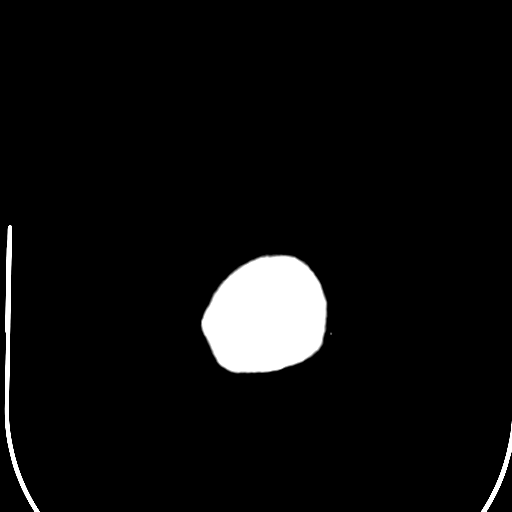

[16 of 30 positions shown; findings below may reference images not displayed]

FINDINGS: No evidence for acute infarction, hemorrhage, mass lesion,
hydrocephalus, or extra-axial fluid. Mild atrophy appropriate for
age. Moderately advanced chronic microvascular ischemic change. No
CT signs of proximal vascular thrombosis. Vascular calcification.
Calvarium intact. No sinus air-fluid level or mastoid inflammation.
Chronic changes as described. Right cataract extraction.
IMPRESSION: No acute intracranial abnormality.  Similar appearance to prior MR.

## 2017-06-01 ENCOUNTER — Other Ambulatory Visit: Payer: Self-pay

## 2017-06-01 ENCOUNTER — Inpatient Hospital Stay (HOSPITAL_COMMUNITY): Payer: Medicare Other

## 2017-06-01 ENCOUNTER — Emergency Department (HOSPITAL_COMMUNITY): Payer: Medicare Other

## 2017-06-01 ENCOUNTER — Encounter (HOSPITAL_COMMUNITY): Payer: Self-pay | Admitting: Emergency Medicine

## 2017-06-01 ENCOUNTER — Inpatient Hospital Stay (HOSPITAL_COMMUNITY)
Admission: EM | Admit: 2017-06-01 | Discharge: 2017-06-03 | DRG: 065 | Disposition: A | Discharge status: Home Health | Payer: Medicare Other | Attending: Family Medicine | Admitting: Family Medicine

## 2017-06-01 DIAGNOSIS — E876 Hypokalemia: Secondary | ICD-10-CM | POA: Diagnosis present

## 2017-06-01 DIAGNOSIS — I6523 Occlusion and stenosis of bilateral carotid arteries: Secondary | ICD-10-CM | POA: Diagnosis present

## 2017-06-01 DIAGNOSIS — I10 Essential (primary) hypertension: Secondary | ICD-10-CM | POA: Diagnosis present

## 2017-06-01 DIAGNOSIS — I739 Peripheral vascular disease, unspecified: Secondary | ICD-10-CM | POA: Diagnosis present

## 2017-06-01 DIAGNOSIS — Z7902 Long term (current) use of antithrombotics/antiplatelets: Secondary | ICD-10-CM

## 2017-06-01 DIAGNOSIS — E785 Hyperlipidemia, unspecified: Secondary | ICD-10-CM | POA: Diagnosis present

## 2017-06-01 DIAGNOSIS — I6302 Cerebral infarction due to thrombosis of basilar artery: Secondary | ICD-10-CM | POA: Diagnosis not present

## 2017-06-01 DIAGNOSIS — I639 Cerebral infarction, unspecified: Secondary | ICD-10-CM | POA: Diagnosis not present

## 2017-06-01 DIAGNOSIS — E869 Volume depletion, unspecified: Secondary | ICD-10-CM | POA: Diagnosis present

## 2017-06-01 DIAGNOSIS — I358 Other nonrheumatic aortic valve disorders: Secondary | ICD-10-CM | POA: Diagnosis present

## 2017-06-01 DIAGNOSIS — Z66 Do not resuscitate: Secondary | ICD-10-CM | POA: Diagnosis present

## 2017-06-01 DIAGNOSIS — R29810 Facial weakness: Secondary | ICD-10-CM | POA: Diagnosis present

## 2017-06-01 DIAGNOSIS — Z79899 Other long term (current) drug therapy: Secondary | ICD-10-CM

## 2017-06-01 DIAGNOSIS — I679 Cerebrovascular disease, unspecified: Secondary | ICD-10-CM | POA: Diagnosis not present

## 2017-06-01 DIAGNOSIS — F0390 Unspecified dementia without behavioral disturbance: Secondary | ICD-10-CM | POA: Diagnosis present

## 2017-06-01 DIAGNOSIS — R29703 NIHSS score 3: Secondary | ICD-10-CM | POA: Diagnosis present

## 2017-06-01 DIAGNOSIS — G8194 Hemiplegia, unspecified affecting left nondominant side: Secondary | ICD-10-CM | POA: Diagnosis present

## 2017-06-01 DIAGNOSIS — I6329 Cerebral infarction due to unspecified occlusion or stenosis of other precerebral arteries: Secondary | ICD-10-CM | POA: Diagnosis present

## 2017-06-01 DIAGNOSIS — I351 Nonrheumatic aortic (valve) insufficiency: Secondary | ICD-10-CM | POA: Diagnosis not present

## 2017-06-01 LAB — URINALYSIS, ROUTINE W REFLEX MICROSCOPIC
Bilirubin Urine: NEGATIVE
GLUCOSE, UA: NEGATIVE mg/dL
KETONES UR: NEGATIVE mg/dL
NITRITE: NEGATIVE
PROTEIN: NEGATIVE mg/dL
Specific Gravity, Urine: 1.015 (ref 1.005–1.030)
pH: 7 (ref 5.0–8.0)

## 2017-06-01 LAB — COMPREHENSIVE METABOLIC PANEL
ALT: 14 U/L (ref 14–54)
AST: 25 U/L (ref 15–41)
Albumin: 3.7 g/dL (ref 3.5–5.0)
Alkaline Phosphatase: 71 U/L (ref 38–126)
Anion gap: 11 (ref 5–15)
BUN: 27 mg/dL — ABNORMAL HIGH (ref 6–20)
CHLORIDE: 105 mmol/L (ref 101–111)
CO2: 27 mmol/L (ref 22–32)
Calcium: 9.3 mg/dL (ref 8.9–10.3)
Creatinine, Ser: 0.86 mg/dL (ref 0.44–1.00)
GFR calc Af Amer: >60 mL/min (ref >60)
GFR, EST NON AFRICAN AMERICAN: 56 mL/min — AB (ref >60)
Glucose, Bld: 106 mg/dL — ABNORMAL HIGH (ref 65–99)
POTASSIUM: 3.7 mmol/L (ref 3.5–5.1)
SODIUM: 143 mmol/L (ref 135–145)
Total Bilirubin: 0.4 mg/dL (ref 0.3–1.2)
Total Protein: 7.1 g/dL (ref 6.5–8.1)

## 2017-06-01 LAB — CBC
HCT: 38.4 % (ref 36.0–46.0)
Hemoglobin: 12.4 g/dL (ref 12.0–15.0)
MCH: 30.6 pg (ref 26.0–34.0)
MCHC: 32.3 g/dL (ref 30.0–36.0)
MCV: 94.8 fL (ref 78.0–100.0)
PLATELETS: 192 10*3/uL (ref 150–400)
RBC: 4.05 MIL/uL (ref 3.87–5.11)
RDW: 12.8 % (ref 11.5–15.5)
WBC: 6.5 10*3/uL (ref 4.0–10.5)

## 2017-06-01 LAB — DIFFERENTIAL
BASOS ABS: 0 10*3/uL (ref 0.0–0.1)
Basophils Relative: 1 %
EOS ABS: 0.1 10*3/uL (ref 0.0–0.7)
Eosinophils Relative: 1 %
Lymphocytes Relative: 40 %
Lymphs Abs: 2.6 10*3/uL (ref 0.7–4.0)
Monocytes Absolute: 0.4 10*3/uL (ref 0.1–1.0)
Monocytes Relative: 6 %
NEUTROS PCT: 52 %
Neutro Abs: 3.4 10*3/uL (ref 1.7–7.7)

## 2017-06-01 LAB — PROTIME-INR
INR: 0.91
Prothrombin Time: 12.1 seconds (ref 11.4–15.2)

## 2017-06-01 LAB — I-STAT CHEM 8, ED
BUN: 26 mg/dL — AB (ref 6–20)
Calcium, Ion: 1.17 mmol/L (ref 1.15–1.40)
Chloride: 104 mmol/L (ref 101–111)
Creatinine, Ser: 0.7 mg/dL (ref 0.44–1.00)
Glucose, Bld: 105 mg/dL — ABNORMAL HIGH (ref 65–99)
HCT: 36 % (ref 36.0–46.0)
Hemoglobin: 12.2 g/dL (ref 12.0–15.0)
Potassium: 3.6 mmol/L (ref 3.5–5.1)
Sodium: 143 mmol/L (ref 135–145)
TCO2: 29 mmol/L (ref 22–32)

## 2017-06-01 LAB — CREATININE, SERUM
Creatinine, Ser: 0.75 mg/dL (ref 0.44–1.00)
GFR calc non Af Amer: >60 mL/min (ref >60)

## 2017-06-01 LAB — APTT: APTT: 30 s (ref 24–36)

## 2017-06-01 LAB — RAPID URINE DRUG SCREEN, HOSP PERFORMED
Amphetamines: NOT DETECTED
Barbiturates: NOT DETECTED
Benzodiazepines: NOT DETECTED
Cocaine: NOT DETECTED
OPIATES: NOT DETECTED
Tetrahydrocannabinol: NOT DETECTED

## 2017-06-01 LAB — ETHANOL

## 2017-06-01 LAB — I-STAT TROPONIN, ED: TROPONIN I, POC: 0 ng/mL (ref 0.00–0.08)

## 2017-06-01 LAB — CBG MONITORING, ED: Glucose-Capillary: 99 mg/dL (ref 65–99)

## 2017-06-01 MED ORDER — LORAZEPAM 2 MG/ML IJ SOLN
0.2500 mg | Freq: Once | INTRAMUSCULAR | Status: AC | PRN
  Administered 2017-06-01: 0.25 mg via INTRAVENOUS
  Filled 2017-06-01: qty 1

## 2017-06-01 MED ORDER — ACETAMINOPHEN 160 MG/5ML PO SOLN: 650.0000 mg | ORAL | Status: DC | PRN

## 2017-06-01 MED ORDER — LORAZEPAM 2 MG/ML IJ SOLN
0.5000 mg | Freq: Once | INTRAMUSCULAR | Status: AC
  Administered 2017-06-01: 0.5 mg via INTRAVENOUS
  Filled 2017-06-01: qty 1

## 2017-06-01 MED ORDER — SENNOSIDES-DOCUSATE SODIUM 8.6-50 MG PO TABS: 1.0000 | ORAL_TABLET | Freq: Every evening | ORAL | Status: DC | PRN

## 2017-06-01 MED ORDER — ENOXAPARIN SODIUM 40 MG/0.4ML ~~LOC~~ SOLN
40.0000 mg | SUBCUTANEOUS | Status: DC
  Administered 2017-06-01 – 2017-06-02 (×2): 40 mg via SUBCUTANEOUS
  Filled 2017-06-01 (×2): qty 0.4

## 2017-06-01 MED ORDER — GADOBENATE DIMEGLUMINE 529 MG/ML IV SOLN
10.0000 mL | Freq: Once | INTRAVENOUS | Status: AC | PRN
  Administered 2017-06-01: 10 mL via INTRAVENOUS

## 2017-06-01 MED ORDER — ATORVASTATIN CALCIUM 80 MG PO TABS
80.0000 mg | ORAL_TABLET | Freq: Every day | ORAL | Status: DC
  Administered 2017-06-02: 80 mg via ORAL
  Filled 2017-06-01: qty 1

## 2017-06-01 MED ORDER — ACETAMINOPHEN 325 MG PO TABS: 650.0000 mg | ORAL_TABLET | ORAL | Status: DC | PRN

## 2017-06-01 MED ORDER — STROKE: EARLY STAGES OF RECOVERY BOOK
Freq: Once | Status: AC
  Administered 2017-06-01: 22:00:00

## 2017-06-01 MED ORDER — ASPIRIN 81 MG PO CHEW
324.0000 mg | CHEWABLE_TABLET | Freq: Once | ORAL | Status: AC
  Administered 2017-06-01: 324 mg via ORAL
  Filled 2017-06-01: qty 4

## 2017-06-01 MED ORDER — ACETAMINOPHEN 650 MG RE SUPP: 650.0000 mg | RECTAL | Status: DC | PRN

## 2017-06-01 MED ORDER — ASPIRIN 81 MG PO CHEW
81.0000 mg | CHEWABLE_TABLET | Freq: Every day | ORAL | Status: DC
  Administered 2017-06-01 – 2017-06-03 (×3): 81 mg via ORAL
  Filled 2017-06-01 (×3): qty 1

## 2017-06-01 NOTE — H&P (Signed)
History and Physical    [name]:[id number] DOB: 05-Feb-1922 DOA: 06/01/2017  PCP: Tally Joe, MD  Patient coming from: alf  I have personally briefly reviewed patient's old medical records in [facility]  Chief Complaint: perioral numbness, facial droop  HPI: Alexandra Mckee is a 84 y.o. female with medical history significant of hypertension, hyperlipidemia, hypokalemia presenting with left-sided facial droop and perioral numbness with imaging findings concerning for stroke.   Last known normal was sometime yesterday.  This morning she noticed perioral numbness.  When her family member saw her, they noticed a left-sided facial droop.  She was also noted to be very weak when she tried to stand.  Her daughter came and saw her and brought her to the hospital because of the left-sided facial droop as well as her weakness.  She denies any numbness or tingling.  She notes that she felt weak all over, but does not specify any one particular location.  No dysarthria that the family noted.  She was a bit more confused than normal, A&O x2, not aware of the date (which is not baseline for her).    ED Course: Labs, EKG, imaging.  MRI with evidence of stroke.  Seen by tele-neurology in the ED who recommended inpatient stroke workup.  Inpatient neurology consultation.  Review of Systems: As per HPI otherwise 10 point review of systems negative.   Past Medical History:  Diagnosis Date  . Hyperlipidemia   . Hypertension   . Hypokalemia     Past Surgical History:  Procedure Laterality Date  . ABDOMINAL HYSTERECTOMY       reports that she has never smoked. She has never used smokeless tobacco. She reports that she does not drink alcohol or use drugs.  No Known Allergies  Family History  Problem Relation Age of Onset  . Heart attack Father     Prior to Admission medications   Medication Sig Start Date End Date Taking? Authorizing Provider  hydrochlorothiazide (HYDRODIURIL) 25 MG  tablet Take 25 mg by mouth daily.   Yes [provider]  mirtazapine (REMERON) 15 MG tablet Take 15 mg by mouth at bedtime.   Yes [provider]  potassium chloride (K-DUR) 10 MEQ tablet Take 1 tablet (10 mEq total) by mouth 2 (two) times daily. 08/01/13  Yes Vanetta Mulders, MD    Physical Exam: Vitals:   06/01/17 1715 06/01/17 1745 06/01/17 1815 06/01/17 1845  BP: (!) 149/65 (!) 146/68 (!) 157/72 (!) 157/77  Pulse: 65 63 64 60  Resp: 18 14 15 13   Temp:      TempSrc:      SpO2: 94% 96% 97% 99%  Weight:      Height:        Constitutional: NAD, calm, comfortable Vitals:   06/01/17 1715 06/01/17 1745 06/01/17 1815 06/01/17 1845  BP: (!) 149/65 (!) 146/68 (!) 157/72 (!) 157/77  Pulse: 65 63 64 60  Resp: 18 14 15 13   Temp:      TempSrc:      SpO2: 94% 96% 97% 99%  Weight:      Height:       Eyes: PERRL, lids and conjunctivae normal ENMT: Mucous membranes are moist. Posterior pharynx clear of any exudate or lesions.Normal dentition.  Neck: normal, supple, no masses, no thyromegaly Respiratory: clear to auscultation bilaterally, no wheezing, no crackles. Normal respiratory effort. No accessory muscle use.  Cardiovascular: Regular rate and rhythm, no murmurs / rubs / gallops. No  extremity edema. 2+ pedal pulses. No carotid bruits.  Abdomen: no tenderness, no masses palpated. No hepatosplenomegaly. Bowel sounds positive.  Musculoskeletal: no clubbing / cyanosis. No joint deformity upper and lower extremities. Good ROM, no contractures. Normal muscle tone.  Skin: no rashes, lesions, ulcers. No induration Neurologic: CN 2-12 notable for L sided facial droop. Sensation intact.. Strength symmetric throughout, 5/5.  A&Ox2. Psychiatric: Normal judgment and insight. Normal mood.   Labs on Admission: I have personally reviewed following labs and imaging studies  CBC: Recent Labs  Lab 06/01/17 1047 06/01/17 1054  WBC  --  6.5  NEUTROABS  --  3.4  HGB 12.2 12.4    HCT 36.0 38.4  MCV  --  94.8  PLT  --  192   Basic Metabolic Panel: Recent Labs  Lab 06/01/17 1047 06/01/17 1054  NA 143 143  K 3.6 3.7  CL 104 105  CO2  --  27  GLUCOSE 105* 106*  BUN 26* 27*  CREATININE 0.70 0.86  CALCIUM  --  9.3   GFR: Estimated Creatinine Clearance: 36 mL/min (by C-G formula based on SCr of 0.86 mg/dL). Liver Function Tests: Recent Labs  Lab 06/01/17 1054  AST 25  ALT 14  ALKPHOS 71  BILITOT 0.4  PROT 7.1  ALBUMIN 3.7   No results for input(s): LIPASE, AMYLASE in the last 168 hours. No results for input(s): AMMONIA in the last 168 hours. Coagulation Profile: Recent Labs  Lab 06/01/17 1054  INR 0.91   Cardiac Enzymes: No results for input(s): CKTOTAL, CKMB, CKMBINDEX, TROPONINI in the last 168 hours. BNP (last 3 results) No results for input(s): PROBNP in the last 8760 hours. HbA1C: No results for input(s): HGBA1C in the last 72 hours. CBG: Recent Labs  Lab 06/01/17 1036  GLUCAP 99   Lipid Profile: No results for input(s): CHOL, HDL, LDLCALC, TRIG, CHOLHDL, LDLDIRECT in the last 72 hours. Thyroid Function Tests: No results for input(s): TSH, T4TOTAL, FREET4, T3FREE, THYROIDAB in the last 72 hours. Anemia Panel: No results for input(s): VITAMINB12, FOLATE, FERRITIN, TIBC, IRON, RETICCTPCT in the last 72 hours. Urine analysis:    Component Value Date/Time   COLORURINE YELLOW 06/01/2017 1016   APPEARANCEUR TURBID (A) 06/01/2017 1016   LABSPEC 1.015 06/01/2017 1016   PHURINE 7.0 06/01/2017 1016   GLUCOSEU NEGATIVE 06/01/2017 1016   HGBUR SMALL (A) 06/01/2017 1016   BILIRUBINUR NEGATIVE 06/01/2017 1016   KETONESUR NEGATIVE 06/01/2017 1016   PROTEINUR NEGATIVE 06/01/2017 1016   UROBILINOGEN 1.0 12/23/2013 1438   NITRITE NEGATIVE 06/01/2017 1016   LEUKOCYTESUR LARGE (A) 06/01/2017 1016    Radiological Exams on Admission: Mr Brain Wo Contrast  Result Date: 06/01/2017 CLINICAL DATA:  Numbness to the LEFT side of the face upon  awakening. Facial droop, and LEFT upper extremity weakness. EXAM: MRI HEAD WITHOUT CONTRAST TECHNIQUE: Multiplanar, multiecho pulse sequences of the brain and surrounding structures were obtained without intravenous contrast. COMPARISON:  Code stroke CT 06/01/2017. MR head 02/12/2011. CT head 08/01/2013. FINDINGS: The patient was unable to remain motionless for the exam. Small or subtle lesions could be overlooked. Brain: In the RIGHT mid pons, there is a paramedian focus of restricted diffusion, approximate 3 x 7 mm cross-section, corresponding low ADC, consistent with acute infarction. No other areas of acute infarction are identified. No hemorrhage, mass lesion, hydrocephalus, or extra-axial fluid. Advanced cerebral and cerebellar atrophy. Extensive T2 and FLAIR hyperintensities throughout the white matter, consistent with small vessel disease. Small vessel microangiopathy changes are also  observed in the brainstem, although there is no discrete lacunar infarct to correlate with the RIGHT lower pontine CT hypodensity. Vascular: Normal flow voids. Skull and upper cervical spine: Normal marrow signal. Sinuses/Orbits: Negative. Other: None. Compared with 2012, there is progression of atrophy and small vessel disease. The acute brainstem infarct on today's MR is not visible on CT. IMPRESSION: Acute RIGHT paramedian pontine infarct, nonhemorrhagic. Atrophy and small vessel disease progressive from 2012. Electronically Signed   By: Elsie StainJohn T Curnes M.D.   On: 06/01/2017 12:30   Ct Head Code Stroke Wo Contrast  Addendum Date: 06/01/2017   ADDENDUM REPORT: 06/01/2017 11:18 ADDENDUM: Study discussed by telephone with Dr. Jomarie LongsJOSEPH ZAMMIT on 06/01/2017 at 1036 hours. Electronically Signed   By: Odessa FlemingH  Hall M.D.   On: 06/01/2017 11:18   Result Date: 06/01/2017 CLINICAL DATA:  Code stroke. 84 year old female with facial droop, left upper extremity weakness, left facial numbness. EXAM: CT HEAD WITHOUT CONTRAST TECHNIQUE:  Contiguous axial images were obtained from the base of the skull through the vertex without intravenous contrast. COMPARISON:  Head CT without contrast 08/01/2013 and brain MRI 02/12/2011. FINDINGS: Brain: Stable cerebral volume since 2015. Chronic Patchy and confluent bilateral cerebral white matter hypodensity. Hypodensity in the right paracentral pons appears stable since 2015, but new since the prior MRI. No acute intracranial hemorrhage identified. No cortically based acute infarct identified. No midline shift, mass effect, or evidence of intracranial mass lesion. Stable ventricle size and configuration. Chronic dystrophic calcification in the left cerebellum is stable. Vascular: Calcified atherosclerosis at the skull base. No suspicious intracranial vascular hyperdensity. Skull: Stable.  No acute osseous abnormality identified. Sinuses/Orbits: Visualized paranasal sinuses and mastoids are stable and well pneumatized. Other: No acute orbit or scalp soft tissue finding. ASPECTS [facility]([place] Stroke Program Early CT Score) - Ganglionic level infarction (caudate, lentiform nuclei, internal capsule, insula, M1-M3 cortex): 7 - Supraganglionic infarction (M4-M6 cortex): 3 Total score (0-10 with 10 being normal): 10 IMPRESSION: 1. No acute intracranial hemorrhage or acute cortically based infarct identified. ASPECTS is 10. 2. Chronically advanced small vessel disease suspected, but appears stable by CT since 2015. Electronically Signed: By: Odessa FlemingH  Hall M.D. On: 06/01/2017 10:35    EKG: Independently reviewed. NSR  Assessment/Plan Active Problems:   Stroke (HCC)  Acute CVA  R paramedian pontine infarct:  L facial droop and R facial numbness.  ASA 325 -> 81 daily Atorva 80 MRA head/neck Echo Telemetry PT/OT/speech Lipids A1c Teleneurology, Neurology cs, appreciate recs  Pyuria: no UTI symptoms.  Follow cx.  Contaminated with squams.  HTN: hold HCTZ, permissive htn  Hypokalemia: continue potassium  DVT  prophylaxis: lovenox  Code Status: DNR  Family Communication: daughter at bedside  Disposition Plan: inpatient for stroke work up  Consults called: neurology Admission status: inpatient    Lacretia Nicksaldwell Powell MD [facility] Pager [phone]  If 7PM-7AM, please contact night-coverage [email or web address] Password [email or web address]  06/01/2017, 8:01 PM

## 2017-06-01 NOTE — Progress Notes (Signed)
   [facility] Tele[facility]urology Consult Services  Impression:  R/o Stroke right subcortical  -rf of HTN, HLD, noncompliant with statins in the past.     Not a tpa candidate due to: LSN yesterday evening, per daughter. Outside tpa window Symptoms (not) consistent with LVO therefore will not need emergent CTA imaging. No evidence of aphasia, neglect, visual deficits.    Differential Diagnosis:   1. Cardioembolic stroke  2. Small vessel disease/lacune  3. Thromboembolic, artery-to-artery mechanism  4. Hypercoagulable state-related infarct  5. Transient ischemic attack  6. Thrombotic mechanism, large artery disease   Comments:   [facility] contacted: 10:27 [facility] at bedside: 10:36 NIHSS assessment time: 10:54  Recommendations:  -telemetry monitoring to ro paroxysmal Afib -antiplatelet agent -MRI and MRA h/n -2d echo -check lipid panel and A1C -PT/OT/speech evaluation -NPO until dysphagia screen -DVT prophylaxis  Inpatient neurology consultation Inpatient stroke evaluation as per Neurology/ Internal Medicine Discussed with ED MD Please call with questions  -----------------------------------------------------------------------------------------  CC left facial droop and right sided paresthesias.   History of Present Illness   Patient is a  84 yr old with PMH of HTN, anxiety who presents from a nursing facility after being found this morning with left facial droop and she was reporting right facial numbness. She has a hx of HLD but did not want to take medications.  No hx of Afib, no smoking hx, no prior CVA Denies speech deficits, visual changes LKW yesterday, woke up with these deficits, confirmed by daughter and nursing facility.   Diagnostic: CT brain: negative. Aspects 10, chronic microvascular changes.   Exam: 1a- LOC: Keenly responsive - 0   1b- LOC questions: Answers both questions correctly - 0     1c- LOC commands- Performs both tasks  correctly- 0     2- Gaze: Normal; no gaze paresis or gaze deviation - 0     3- Visual Fields: normal, no Visual field deficit - 0     4- Facial movements: left facial palsy - 1      5- Upper limb motor - mild pronator drift on left -1     6- Lower limb motor - no drift - 0      7- Limb Coordination: absent ataxia - 0      8- Sensory : left  sensory loss - 1     9- Language - No aphasia - 0      10- Speech - No dysarthria -0     11- Neglect / Extinction - none found -0     NIHSS score   3    Medical Decision Making:  - Extensive number of diagnosis or management options are considered above.   - Extensive amount of complex data reviewed.   - High risk of complication and/or morbidity or mortality are associated with differential diagnostic considerations above.  - There may be Uncertain outcome and increased probability of prolonged functional impairment or high probability of severe prolonged functional impairment associated with some of these differential diagnosis.  Medical Data Reviewed:  1.Data reviewed include clinical labs, radiology,  Medical Tests;   2.Tests results discussed w/performing or interpreting physician;   3.Obtaining/reviewing old medical records;  4.Obtaining case history from another source;  5.Independent review of image, tracing or specimen.    Patient was informed the Neurology Consult would happen via telehealth (remote video) and consented to receiving care in this manner.

## 2017-06-01 NOTE — ED Notes (Signed)
Per Dr Estell HarpinZammit, patient can eat. Given Malawiturkey sandwich, apple sauce and cranberry juice.

## 2017-06-01 NOTE — ED Notes (Signed)
Pt transported to CT ?

## 2017-06-01 NOTE — ED Notes (Signed)
Zammit MD alerted at time of triage start pt numbness to left side of face when awoke this am; last seen normal 8 pm yesterday. Positive for droop. Some left upper extremity weakness noted with triage.

## 2017-06-01 NOTE — ED Notes (Signed)
[facility] cart at beside.

## 2017-06-01 NOTE — ED Provider Notes (Signed)
[facility]-EMERGENCY DEPT Provider Note   CSN: [id number] Arrival date & time: 06/01/17  1006     History   Chief Complaint Chief Complaint  Patient presents with  . Numbness    HPI BRANAE CRAIL is a 84 y.o. female.  Patient's daughter noticed that the patient had left sided facial drooping with weakness in the left arm.  She was supposedly last seen normal at 8 p.m. yesterday  The history is provided by the patient and a relative. No language interpreter was used.  Weakness  Primary symptoms include focal weakness. This is a new problem. The current episode started 12 to 24 hours ago. The problem has not changed since onset.There was no focality noted. There has been no fever. Pertinent negatives include no shortness of breath. There were no medications administered prior to arrival. Associated medical issues do not include trauma.    Past Medical History:  Diagnosis Date  . Hyperlipidemia   . Hypertension   . Hypokalemia     Patient Active Problem List   Diagnosis Date Noted  . Memory loss 08/28/2013    Past Surgical History:  Procedure Laterality Date  . ABDOMINAL HYSTERECTOMY       OB History   None      Home Medications    Prior to Admission medications   Medication Sig Start Date End Date Taking? Authorizing Provider  hydrochlorothiazide (HYDRODIURIL) 25 MG tablet Take 25 mg by mouth daily.   Yes [provider]  mirtazapine (REMERON) 15 MG tablet Take 15 mg by mouth at bedtime.   Yes [provider]  potassium chloride (K-DUR) 10 MEQ tablet Take 1 tablet (10 mEq total) by mouth 2 (two) times daily. 08/01/13  Yes Vanetta Mulders, MD    Family History Family History  Problem Relation Age of Onset  . Heart attack Father     Social History Social History   Tobacco Use  . Smoking status: Never Smoker  Substance Use Topics  . Alcohol use: No  . Drug use: No     Allergies   Patient has no known  allergies.   Review of Systems Review of Systems  Unable to perform ROS: Dementia  Respiratory: Negative for shortness of breath.   Neurological: Positive for focal weakness and weakness.     Physical Exam Updated Vital Signs BP (!) 137/59   Pulse 61   Temp (!) 97.3 F (36.3 C)   Resp 15   Ht 5\' 5"  (1.651 m)   Wt 58.1 kg (128 lb)   SpO2 98%   BMI 21.30 kg/m   Physical Exam  Constitutional: She appears well-developed.  HENT:  Head: Normocephalic.  Left facial drooping  Eyes: Conjunctivae and EOM are normal. No scleral icterus.  Neck: Neck supple. No thyromegaly present.  Cardiovascular: Normal rate and regular rhythm. Exam reveals no gallop and no friction rub.  No murmur heard. Pulmonary/Chest: No stridor. She has no wheezes. She has no rales. She exhibits no tenderness.  Abdominal: She exhibits no distension. There is no tenderness. There is no rebound.  Musculoskeletal: Normal range of motion. She exhibits no edema.  Weakness in left arm  Lymphadenopathy:    She has no cervical adenopathy.  Neurological: She is alert. She exhibits normal muscle tone. Coordination normal.  Patient oriented to person and place only  Skin: No rash noted. No erythema.     ED Treatments / Results  Labs (all labs ordered are listed, but only abnormal  results are displayed) Labs Reviewed  COMPREHENSIVE METABOLIC PANEL - Abnormal; Notable for the following components:      Result Value   Glucose, Bld 106 (*)    BUN 27 (*)    GFR calc non Af Amer 56 (*)    All other components within normal limits  URINALYSIS, ROUTINE W REFLEX MICROSCOPIC - Abnormal; Notable for the following components:   APPearance TURBID (*)    Hgb urine dipstick SMALL (*)    Leukocytes, UA LARGE (*)    Bacteria, UA MANY (*)    Squamous Epithelial / LPF TOO NUMEROUS TO COUNT (*)    All other components within normal limits  I-STAT CHEM 8, ED - Abnormal; Notable for the following components:   BUN 26 (*)     Glucose, Bld 105 (*)    All other components within normal limits  PROTIME-INR  APTT  CBC  DIFFERENTIAL  RAPID URINE DRUG SCREEN, HOSP PERFORMED  ETHANOL  I-STAT TROPONIN, ED  CBG MONITORING, ED  I-STAT TROPONIN, ED    EKG None  Radiology Mr Brain Wo Contrast  Result Date: 06/01/2017 CLINICAL DATA:  Numbness to the LEFT side of the face upon awakening. Facial droop, and LEFT upper extremity weakness. EXAM: MRI HEAD WITHOUT CONTRAST TECHNIQUE: Multiplanar, multiecho pulse sequences of the brain and surrounding structures were obtained without intravenous contrast. COMPARISON:  Code stroke CT 06/01/2017. MR head 02/12/2011. CT head 08/01/2013. FINDINGS: The patient was unable to remain motionless for the exam. Small or subtle lesions could be overlooked. Brain: In the RIGHT mid pons, there is a paramedian focus of restricted diffusion, approximate 3 x 7 mm cross-section, corresponding low ADC, consistent with acute infarction. No other areas of acute infarction are identified. No hemorrhage, mass lesion, hydrocephalus, or extra-axial fluid. Advanced cerebral and cerebellar atrophy. Extensive T2 and FLAIR hyperintensities throughout the white matter, consistent with small vessel disease. Small vessel microangiopathy changes are also observed in the brainstem, although there is no discrete lacunar infarct to correlate with the RIGHT lower pontine CT hypodensity. Vascular: Normal flow voids. Skull and upper cervical spine: Normal marrow signal. Sinuses/Orbits: Negative. Other: None. Compared with 2012, there is progression of atrophy and small vessel disease. The acute brainstem infarct on today's MR is not visible on CT. IMPRESSION: Acute RIGHT paramedian pontine infarct, nonhemorrhagic. Atrophy and small vessel disease progressive from 2012. Electronically Signed   By: Elsie Stain M.D.   On: 06/01/2017 12:30   Ct Head Code Stroke Wo Contrast  Addendum Date: 06/01/2017   ADDENDUM REPORT:  06/01/2017 11:18 ADDENDUM: Study discussed by telephone with Dr. Jomarie Longs Twain Stenseth on 06/01/2017 at 1036 hours. Electronically Signed   By: Odessa Fleming M.D.   On: 06/01/2017 11:18   Result Date: 06/01/2017 CLINICAL DATA:  Code stroke. 85 year old female with facial droop, left upper extremity weakness, left facial numbness. EXAM: CT HEAD WITHOUT CONTRAST TECHNIQUE: Contiguous axial images were obtained from the base of the skull through the vertex without intravenous contrast. COMPARISON:  Head CT without contrast 08/01/2013 and brain MRI 02/12/2011. FINDINGS: Brain: Stable cerebral volume since 2015. Chronic Patchy and confluent bilateral cerebral white matter hypodensity. Hypodensity in the right paracentral pons appears stable since 2015, but new since the prior MRI. No acute intracranial hemorrhage identified. No cortically based acute infarct identified. No midline shift, mass effect, or evidence of intracranial mass lesion. Stable ventricle size and configuration. Chronic dystrophic calcification in the left cerebellum is stable. Vascular: Calcified atherosclerosis at the skull base. No  suspicious intracranial vascular hyperdensity. Skull: Stable.  No acute osseous abnormality identified. Sinuses/Orbits: Visualized paranasal sinuses and mastoids are stable and well pneumatized. Other: No acute orbit or scalp soft tissue finding. ASPECTS [facility]([place] Stroke Program Early CT Score) - Ganglionic level infarction (caudate, lentiform nuclei, internal capsule, insula, M1-M3 cortex): 7 - Supraganglionic infarction (M4-M6 cortex): 3 Total score (0-10 with 10 being normal): 10 IMPRESSION: 1. No acute intracranial hemorrhage or acute cortically based infarct identified. ASPECTS is 10. 2. Chronically advanced small vessel disease suspected, but appears stable by CT since 2015. Electronically Signed: By: Odessa FlemingH  Hall M.D. On: 06/01/2017 10:35    Procedures Procedures (including critical care time)  Medications Ordered in  ED Medications  LORazepam (ATIVAN) injection 0.5 mg (0.5 mg Intravenous Given 06/01/17 1120)     Initial Impression / Assessment and Plan / ED Course  I have reviewed the triage vital signs and the nursing notes.  Pertinent labs & imaging results that were available during my care of the patient were reviewed by me and considered in my medical decision making (see chart for details).     CRITICAL CARE Performed by: Bethann BerkshireJoseph Zeidy Tayag Total critical care time: 40 minutes Critical care time was exclusive of separately billable procedures and treating other patients. Critical care was necessary to treat or prevent imminent or life-threatening deterioration. Critical care was time spent personally by me on the following activities: development of treatment plan with patient and/or surrogate as well as nursing, discussions with consultants, evaluation of patient's response to treatment, examination of patient, obtaining history from patient or surrogate, ordering and performing treatments and interventions, ordering and review of laboratory studies, ordering and review of radiographic studies, pulse oximetry and re-evaluation of patient's condition.  Patient with an acute stroke.  Patient was seen by telemetry neurology who felt like the patient need to be admitted for stroke workup but did not need to rule out large vessel disease.  She had an MRI that shows a small stroke.  [facility] will consult Final Clinical Impressions(s) / ED Diagnoses   Final diagnoses:  Acute embolic stroke [facility](HCC)    ED Discharge Orders    None       Bethann BerkshireZammit, Anthony Roland, MD 06/01/17 1503

## 2017-06-01 NOTE — ED Notes (Signed)
[phone] [facility]

## 2017-06-01 NOTE — Progress Notes (Signed)
PT, arrived from [facility] via  [facility] by carelink to the unit.

## 2017-06-01 NOTE — Consult Note (Signed)
Referring Physician: Dr. Roderic Palau    Chief Complaint:  Left facial numbness, LUE weakness and facial droop  HPI: Alexandra Mckee is an 84 y.o. female presenting with LUE and left facial droop. The symptoms were noticed today in the AM upon waking. LKN was 8:00 PM on Wednesday.   MRI obtained in the ED was positive for an acute small right pontine perforator ischemic infarction.   MRI brain:  In the RIGHT mid pons, there is a paramedian focus of restricted diffusion, approximate 3 x 7 mm cross-section, corresponding low ADC, consistent with acute infarction. Also noted is advanced cerebral and cerebellar atrophy. Extensive T2 and FLAIR hyperintensities throughout the white matter, consistent with small vessel disease. Small vessel microangiopathy changes are also observed in the brainstem.  LSN: 8:00 PM on Wednesday tPA Given: No: Out of time window  Past Medical History:  Diagnosis Date  . Hyperlipidemia   . Hypertension   . Hypokalemia     Past Surgical History:  Procedure Laterality Date  . ABDOMINAL HYSTERECTOMY      Family History  Problem Relation Age of Onset  . Heart attack Father    Social History:  reports that she has never smoked. She does not have any smokeless tobacco history on file. She reports that she does not drink alcohol or use drugs.  Allergies: No Known Allergies  Home Medications:  HCTZ Remeron K-Dur  ROS: As per HPI.    Physical Examination: Blood pressure (!) 159/67, pulse 65, temperature (!) 97.3 F (36.3 C), resp. rate 17, height _0  (1.651 m), weight 58.1 kg (128 lb), SpO2 98 %.  HEENT: [place]/AT Lungs: Respirations unlabored Ext: Warm and well perfused  Neurologic Examination: Mental Status: Alert and fully oriented. Speech fluent. Had difficulty with a 2-step directional command; otherwise comprehension intact. Some decreased attention. Concentration intact.  Cranial Nerves: II:  Visual fields intact. PERRL.  III,IV, VI: No ptosis. EOMI.   V,VII: Left facial droop. Facial temp sensation intact bilaterally VIII: hearing intact to voice IX,X: Palate rises symmetrically. Mild hypophonia.  XI: Symmetric XII: midline tongue extension  Motor: LUE: 4/5 deltoid, biceps and grip, otherwise 5/5 RUE: 5/5 LLE: 5/5 RLE: 5/5 Sensory: Temp and light touch intact with limbs tested individually. Extinction on the right with DSS.  Deep Tendon Reflexes:  Right biceps and brachioradialis: 1+ Left biceps and brachioradialis: 2+ Bilateral patellae: 1+ Bilateral achilles: 0 Toes downgoing.  Cerebellar: No ataxia with FNF bilaterally.  Gait: Deferred  Results for orders placed or performed during the hospital encounter of 06/01/17 (from the past 48 hour(s))  CBG monitoring, ED     Status: None   Collection Time: 06/01/17 10:36 AM  Result Value Ref Range   Glucose-Capillary 99 65 - 99 mg/dL  I-stat troponin, ED     Status: None   Collection Time: 06/01/17 10:46 AM  Result Value Ref Range   Troponin i, poc 0.00 0.00 - 0.08 ng/mL   Comment 3            Comment: Due to the release kinetics of cTnI, a negative result within the first hours of the onset of symptoms does not rule out myocardial infarction with certainty. If myocardial infarction is still suspected, repeat the test at appropriate intervals.   I-Stat Chem 8, ED     Status: Abnormal   Collection Time: 06/01/17 10:47 AM  Result Value Ref Range   Sodium 143 135 - 145 mmol/L   Potassium 3.6 3.5 - 5.1 mmol/L  Chloride 104 101 - 111 mmol/L   BUN 26 (H) 6 - 20 mg/dL   Creatinine, Ser 0.70 0.44 - 1.00 mg/dL   Glucose, Bld 105 (H) 65 - 99 mg/dL   Calcium, Ion 1.17 1.15 - 1.40 mmol/L   TCO2 29 22 - 32 mmol/L   Hemoglobin 12.2 12.0 - 15.0 g/dL   HCT 36.0 36.0 - 46.0 %  Protime-INR     Status: None   Collection Time: 06/01/17 10:54 AM  Result Value Ref Range   Prothrombin Time 12.1 11.4 - 15.2 seconds   INR 0.91     Comment: Performed at [facility],  [place] [address] [place], [place] [address]  APTT     Status: None   Collection Time: 06/01/17 10:54 AM  Result Value Ref Range   aPTT 30 24 - 36 seconds    Comment: Performed at [facility], [place] [address], [place], [place] [address]  CBC     Status: None   Collection Time: 06/01/17 10:54 AM  Result Value Ref Range   WBC 6.5 4.0 - 10.5 K/uL   RBC 4.05 3.87 - 5.11 MIL/uL   Hemoglobin 12.4 12.0 - 15.0 g/dL   HCT 38.4 36.0 - 46.0 %   MCV 94.8 78.0 - 100.0 fL   MCH 30.6 26.0 - 34.0 pg   MCHC 32.3 30.0 - 36.0 g/dL   RDW 12.8 11.5 - 15.5 %   Platelets 192 150 - 400 K/uL    Comment: Performed at [facility], [place] [address], [place], [place] [address]  Differential     Status: None   Collection Time: 06/01/17 10:54 AM  Result Value Ref Range   Neutrophils Relative % 52 %   Neutro Abs 3.4 1.7 - 7.7 K/uL   Lymphocytes Relative 40 %   Lymphs Abs 2.6 0.7 - 4.0 K/uL   Monocytes Relative 6 %   Monocytes Absolute 0.4 0.1 - 1.0 K/uL   Eosinophils Relative 1 %   Eosinophils Absolute 0.1 0.0 - 0.7 K/uL   Basophils Relative 1 %   Basophils Absolute 0.0 0.0 - 0.1 K/uL    Comment: Performed at [facility], [place] [address] [place], [place] [address]  Comprehensive metabolic panel     Status: Abnormal   Collection Time: 06/01/17 10:54 AM  Result Value Ref Range   Sodium 143 135 - 145 mmol/L   Potassium 3.7 3.5 - 5.1 mmol/L   Chloride 105 101 - 111 mmol/L   CO2 27 22 - 32 mmol/L   Glucose, Bld 106 (H) 65 - 99 mg/dL   BUN 27 (H) 6 - 20 mg/dL   Creatinine, Ser 0.86 0.44 - 1.00 mg/dL   Calcium 9.3 8.9 - 10.3 mg/dL   Total Protein 7.1 6.5 - 8.1 g/dL   Albumin 3.7 3.5 - 5.0 g/dL   AST 25 15 - 41 U/L   ALT 14 14 - 54 U/L   Alkaline Phosphatase 71 38 - 126 U/L   Total Bilirubin 0.4 0.3 - 1.2 mg/dL   GFR calc non Af Amer 56 (L) >60 mL/min   GFR calc Af Amer >60 >60 mL/min    Comment: (NOTE) The eGFR has been calculated using the CKD  EPI equation. This calculation has not been validated in all clinical situations. eGFR's persistently <60 mL/min signify possible Chronic Kidney Disease.    Anion gap 11 5 - 15    Comment: Performed at [facility], [place] [address] [place], [place] [address]   Mr Brain Wo Contrast  Result Date: 06/01/2017 CLINICAL DATA:  Numbness to the LEFT side of the face upon awakening. Facial droop, and LEFT upper extremity weakness. EXAM: MRI HEAD WITHOUT CONTRAST TECHNIQUE: Multiplanar, multiecho pulse sequences of the brain and surrounding structures were obtained without intravenous contrast. COMPARISON:  Code stroke CT 06/01/2017. MR head 02/12/2011. CT head 08/01/2013. FINDINGS: The patient was unable to remain motionless for the exam. Small or subtle lesions could be overlooked. Brain: In the RIGHT mid pons, there is a paramedian focus of restricted diffusion, approximate 3 x 7 mm cross-section, corresponding low ADC, consistent with acute infarction. No other areas of acute infarction are identified. No hemorrhage, mass lesion, hydrocephalus, or extra-axial fluid. Advanced cerebral and cerebellar atrophy. Extensive T2 and FLAIR hyperintensities throughout the white matter, consistent with small vessel disease. Small vessel microangiopathy changes are also observed in the brainstem, although there is no discrete lacunar infarct to correlate with the RIGHT lower pontine CT hypodensity. Vascular: Normal flow voids. Skull and upper cervical spine: Normal marrow signal. Sinuses/Orbits: Negative. Other: None. Compared with 2012, there is progression of atrophy and small vessel disease. The acute brainstem infarct on today's MR is not visible on CT. IMPRESSION: Acute RIGHT paramedian pontine infarct, nonhemorrhagic. Atrophy and small vessel disease progressive from 2012. Electronically Signed   By: Staci Righter M.D.   On: 06/01/2017 12:30   Ct Head Code Stroke Wo Contrast  Addendum Date:  06/01/2017   ADDENDUM REPORT: 06/01/2017 11:18 ADDENDUM: Study discussed by telephone with Dr. Broadus John ZAMMIT on 06/01/2017 at 1036 hours. Electronically Signed   By: Genevie Ann M.D.   On: 06/01/2017 11:18   Result Date: 06/01/2017 CLINICAL DATA:  Code stroke. 83 year old female with facial droop, left upper extremity weakness, left facial numbness. EXAM: CT HEAD WITHOUT CONTRAST TECHNIQUE: Contiguous axial images were obtained from the base of the skull through the vertex without intravenous contrast. COMPARISON:  Head CT without contrast 08/01/2013 and brain MRI 02/12/2011. FINDINGS: Brain: Stable cerebral volume since 2015. Chronic Patchy and confluent bilateral cerebral white matter hypodensity. Hypodensity in the right paracentral pons appears stable since 2015, but new since the prior MRI. No acute intracranial hemorrhage identified. No cortically based acute infarct identified. No midline shift, mass effect, or evidence of intracranial mass lesion. Stable ventricle size and configuration. Chronic dystrophic calcification in the left cerebellum is stable. Vascular: Calcified atherosclerosis at the skull base. No suspicious intracranial vascular hyperdensity. Skull: Stable.  No acute osseous abnormality identified. Sinuses/Orbits: Visualized paranasal sinuses and mastoids are stable and well pneumatized. Other: No acute orbit or scalp soft tissue finding. ASPECTS [facility] Stroke Program Early CT Score) - Ganglionic level infarction (caudate, lentiform nuclei, internal capsule, insula, M1-M3 cortex): 7 - Supraganglionic infarction (M4-M6 cortex): 3 Total score (0-10 with 10 being normal): 10 IMPRESSION: 1. No acute intracranial hemorrhage or acute cortically based infarct identified. ASPECTS is 10. 2. Chronically advanced small vessel disease suspected, but appears stable by CT since 2015. Electronically Signed: By: Genevie Ann M.D. On: 06/01/2017 10:35    Assessment: 84 y.o. female with acute right paramedian  pontine infarction 1. Exam reveals left facial droop and mild LUE weakness.  2. Stroke Risk Factors - HLD and HTN 3. Elevated BUN/Cr, suggestive of volume depletion  Recommendations: 1. HgbA1c, fasting lipid panel 2. MRA of the brain without contrast. MRA neck with contrast 3. PT consult, OT consult, Speech consult 4. Echocardiogram 5. Start ASA  81 mg po qd 6. Given advanced age, statin benefits are most likely outweighed by risks 7. Risk factor modification 8. Telemetry monitoring 9. Frequent neuro checks 10. Out of permissive HTN time window 11. IV hydration   _0  signed: Dr. Kerney Elbe 06/01/2017, 2:15 PM

## 2017-06-01 NOTE — ED Notes (Signed)
Zammit MD at bedside. 

## 2017-06-01 NOTE — Progress Notes (Signed)
Pt off unit to radiology and back  Make comfortable in bed.  Needed assist given pt denied pain.  Neuro assessments unchanged.

## 2017-06-01 NOTE — ED Notes (Signed)
Pt transported for CT Scan.

## 2017-06-02 ENCOUNTER — Inpatient Hospital Stay (HOSPITAL_COMMUNITY): Payer: Medicare Other

## 2017-06-02 DIAGNOSIS — I351 Nonrheumatic aortic (valve) insufficiency: Secondary | ICD-10-CM

## 2017-06-02 DIAGNOSIS — I10 Essential (primary) hypertension: Secondary | ICD-10-CM

## 2017-06-02 DIAGNOSIS — I6302 Cerebral infarction due to thrombosis of basilar artery: Secondary | ICD-10-CM

## 2017-06-02 DIAGNOSIS — I679 Cerebrovascular disease, unspecified: Secondary | ICD-10-CM

## 2017-06-02 LAB — LIPID PANEL
Cholesterol: 280 mg/dL — ABNORMAL HIGH (ref 0–200)
HDL: 49 mg/dL (ref >40)
LDL Cholesterol: 197 mg/dL — ABNORMAL HIGH (ref 0–99)
Total CHOL/HDL Ratio: 5.7 RATIO
Triglycerides: 172 mg/dL — ABNORMAL HIGH (ref <150)
VLDL: 34 mg/dL (ref 0–40)

## 2017-06-02 LAB — CBC
HCT: 36.4 % (ref 36.0–46.0)
HEMOGLOBIN: 11.8 g/dL — AB (ref 12.0–15.0)
MCH: 30.3 pg (ref 26.0–34.0)
MCHC: 32.4 g/dL (ref 30.0–36.0)
MCV: 93.6 fL (ref 78.0–100.0)
PLATELETS: 179 10*3/uL (ref 150–400)
RBC: 3.89 MIL/uL (ref 3.87–5.11)
RDW: 13.1 % (ref 11.5–15.5)
WBC: 6.8 10*3/uL (ref 4.0–10.5)

## 2017-06-02 LAB — ECHOCARDIOGRAM COMPLETE
Height: 65 in
Weight: 2048 oz

## 2017-06-02 LAB — HEMOGLOBIN A1C
Hgb A1c MFr Bld: 5.6 % (ref 4.8–5.6)
Mean Plasma Glucose: 114.02 mg/dL

## 2017-06-02 LAB — MRSA PCR SCREENING: MRSA by PCR: NEGATIVE

## 2017-06-02 MED ORDER — CLOPIDOGREL BISULFATE 75 MG PO TABS
75.0000 mg | ORAL_TABLET | Freq: Every day | ORAL | Status: DC
  Administered 2017-06-02 – 2017-06-03 (×2): 75 mg via ORAL
  Filled 2017-06-02 (×2): qty 1

## 2017-06-02 NOTE — Progress Notes (Signed)
PROGRESS NOTE    Alexandra Mckee  ZOX:[id number] DOB: 02-Feb-1922 DOA: 06/01/2017 PCP: Tally JoeSwayne, David, MD      Brief Narrative:  Alexandra Mckee is a 84 y.o. female with medical history significant of hypertension, hyperlipidemia, hypokalemia presenting with left-sided facial droop and perioral numbness with imaging findings concerning for stroke.       Assessment & Plan:  RIGHT pontine subcentimeter ischemic stroke: MRI shows "In the RIGHT mid pons, there is a paramedian focus of restricted diffusion, approximate 3 x 7 mm cross-section, corresponding low ADC, consistent with acute infarction" -Aspirin ordered -Lipids, hemoglobin A1c show LDL >190, A1c normal -Carotid imaging with MRA shows <70% stenosis bilaterally -Echocardiogram pending -PT/OT/SLP have been consulted -Consult to Neurology, appreciate recommendations -VTE prophylaxis ordered at time of admission -Atrial fibrillation: not present -tPA not given because of poor symptom severity -Dysphagia screen ordered in ER -Non-smoker   Hypertension -Continue HCTZ  Dementia     DVT prophylaxis: Lovenox Code Status: DNR Family Communication: None present MDM and disposition Plan: The below labs and imaging reports were reviewed and summarized above.  The patient was admitted for acute facial droop and hemiparesis, found to have acute stroke.  Workup is proceeding, she will get physical therapy evaluation and hopefully placement tomorrow for her left-sided hemiparesis     Subjective: Feels "down", date malaise.  Left-sided hemiparesis persists.  No slurred speech, confusion.  No fever, vomiting, cough, sputum production, dysuria.  Objective: Vitals:   06/01/17 2250 06/02/17 0050 06/02/17 0250 06/02/17 0450  BP: (!) 149/54 (!) 154/68 (!) 115/56 130/66  Pulse: (!) 56 61 60 (!) 58  Resp: 17 16 16 16   Temp: (!) 97.5 F (36.4 C) 98.2 F (36.8 C) (!) 97.5 F (36.4 C) 97.7 F (36.5 C)  TempSrc: Oral Oral Oral Oral    SpO2: 98% 98% 97% 97%  Weight:      Height:        Intake/Output Summary (Last 24 hours) at 06/02/2017 0640 Last data filed at 06/02/2017 0120 Gross per 24 hour  Intake 480 ml  Output 650 ml  Net -170 ml   Filed Weights   06/01/17 1013  Weight: 58.1 kg (128 lb)    Examination: General appearance: Thin elderly female, lying in bed, no acute distress, eating lunch. HEENT: Anicteric, conjunctiva pink, lids and lashes normal. No nasal deformity, discharge, epistaxis.  Lips moist.   Skin: Warm and dry.  no jaundice.  No suspicious rashes or lesions. Cardiac: RRR, nl S1-S2, no murmurs appreciated.  Capillary refill is brisk.  JVP normal  No LE edema.  Radial pulses 2+ and symmetric. Respiratory: Normal respiratory rate and rhythm.  CTAB without rales or wheezes. Abdomen: Abdomen soft.  no TTP. No ascites, distension, hepatosplenomegaly.   MSK: No deformities or effusions. Neuro: Awake and alert.  EOMI, moves all extremities.  Right-sided facial droop.  Left-sided hemiparesis.  25 minutes speech fluent.    Psych: Sensorium intact and responding to questions, attention normal. Affect normal.  Judgment and insight appear normal.    Data Reviewed: I have personally reviewed following labs and imaging studies:  CBC: Recent Labs  Lab 06/01/17 1047 06/01/17 1054 06/02/17 0403  WBC  --  6.5 6.8  NEUTROABS  --  3.4  --   HGB 12.2 12.4 11.8*  HCT 36.0 38.4 36.4  MCV  --  94.8 93.6  PLT  --  192 179   Basic Metabolic Panel: Recent Labs  Lab 06/01/17 1047 06/01/17 1054  06/01/17 2147  NA 143 143  --   K 3.6 3.7  --   CL 104 105  --   CO2  --  27  --   GLUCOSE 105* 106*  --   BUN 26* 27*  --   CREATININE 0.70 0.86 0.75  CALCIUM  --  9.3  --    GFR: Estimated Creatinine Clearance: 38.7 mL/min (by C-G formula based on SCr of 0.75 mg/dL). Liver Function Tests: Recent Labs  Lab 06/01/17 1054  AST 25  ALT 14  ALKPHOS 71  BILITOT 0.4  PROT 7.1  ALBUMIN 3.7   No results  for input(s): LIPASE, AMYLASE in the last 168 hours. No results for input(s): AMMONIA in the last 168 hours. Coagulation Profile: Recent Labs  Lab 06/01/17 1054  INR 0.91   Cardiac Enzymes: No results for input(s): CKTOTAL, CKMB, CKMBINDEX, TROPONINI in the last 168 hours. BNP (last 3 results) No results for input(s): PROBNP in the last 8760 hours. HbA1C: Recent Labs    06/02/17 0403  HGBA1C 5.6   CBG: Recent Labs  Lab 06/01/17 1036  GLUCAP 99   Lipid Profile: Recent Labs    06/02/17 0404  CHOL 280*  HDL 49  LDLCALC 197*  TRIG 172*  CHOLHDL 5.7   Thyroid Function Tests: No results for input(s): TSH, T4TOTAL, FREET4, T3FREE, THYROIDAB in the last 72 hours. Anemia Panel: No results for input(s): VITAMINB12, FOLATE, FERRITIN, TIBC, IRON, RETICCTPCT in the last 72 hours. Urine analysis:    Component Value Date/Time   COLORURINE YELLOW 06/01/2017 1016   APPEARANCEUR TURBID (A) 06/01/2017 1016   LABSPEC 1.015 06/01/2017 1016   PHURINE 7.0 06/01/2017 1016   GLUCOSEU NEGATIVE 06/01/2017 1016   HGBUR SMALL (A) 06/01/2017 1016   BILIRUBINUR NEGATIVE 06/01/2017 1016   KETONESUR NEGATIVE 06/01/2017 1016   PROTEINUR NEGATIVE 06/01/2017 1016   UROBILINOGEN 1.0 12/23/2013 1438   NITRITE NEGATIVE 06/01/2017 1016   LEUKOCYTESUR LARGE (A) 06/01/2017 1016   Sepsis Labs: @LABRCNTIP (procalcitonin:4,lacticacidven:4)  ) Recent Results (from the past 240 hour(s))  MRSA PCR Screening     Status: None   Collection Time: 06/02/17  1:41 AM  Result Value Ref Range Status   MRSA by PCR NEGATIVE NEGATIVE Final    Comment:        The GeneXpert MRSA Assay (FDA approved for NASAL specimens only), is one component of a comprehensive MRSA colonization surveillance program. It is not intended to diagnose MRSA infection nor to guide or monitor treatment for MRSA infections. Performed at [facility], [address] [place], [place] [address]          Radiology  Studies: Dg Chest 2 View  Result Date: 06/01/2017 CLINICAL DATA:  Stroke. EXAM: CHEST - 2 VIEW COMPARISON:  09/06/2013 FINDINGS: The cardiomediastinal contours are unchanged, borderline cardiomegaly. Aortic atherosclerosis. The lungs are clear. Pulmonary vasculature is normal. No consolidation, pleural effusion, or pneumothorax. No acute osseous abnormalities are seen. The bones are under mineralized. IMPRESSION: Borderline cardiomegaly without acute chest finding. Aortic Atherosclerosis (ICD10-I70.0). Electronically Signed   By: Rubye Oaks M.D.   On: 06/01/2017 23:13   Mr [name] Neck W Contrast  Result Date: 06/01/2017 CLINICAL DATA:  Follow-up stroke. EXAM: MRA NECK WITHOUT AND WITH CONTRAST MRA HEAD WITHOUT CONTRAST TECHNIQUE: Multiplanar and multiecho pulse sequences of the neck were obtained without and with intravenous contrast. Angiographic images of the neck were obtained using MRA technique without and with intravenous contrast; Angiographic images of the Circle of  [place] were obtained using MRA technique without intravenous contrast. CONTRAST:  10mL MULTIHANCE GADOBENATE DIMEGLUMINE 529 MG/ML IV SOLN COMPARISON:  MRI of the head June 01, 2017 FINDINGS: MRA NECK FINDINGS ANTERIOR CIRCULATION: The common carotid arteries are widely patent bilaterally. 60% stenosis RIGHT internal carotid artery origin by NASCET criteria. Moderate luminal irregularity RIGHT internal carotid artery seen with atherosclerosis or fibromuscular dysplasia. 50% stenosis LEFT internal carotid artery origin by NASCET criteria with mild luminal irregularity mid cervical internal carotid artery seen with fibromuscular dysplasia or atherosclerosis. No flow limiting stenosis. POSTERIOR CIRCULATION: Bilateral vertebral arteries are patent to the vertebrobasilar junction. Mild stenosis LEFT vertebral artery origin. No flow limiting stenosis or luminal irregularity. Source images and MIP images were reviewed. MRA HEAD FINDINGS-Mild  motion degraded examination. ANTERIOR CIRCULATION: Normal flow related enhancement of the included cervical, petrous, cavernous and supraclinoid internal carotid arteries. Patent anterior communicating artery. Patent anterior and middle cerebral arteries, severe stenosis distal RIGHT M1 segment severe stenosis proximal RIGHT M2 superior and inferior divisions. Severe stenosis LEFT A2 3 junction. Moderate stenosis LEFT M2 superior division and LEFT M3 segment. No large vessel occlusion nor aneurysm. POSTERIOR CIRCULATION: RIGHT vertebral artery is dominant. Tandem moderate stenosis LEFT V4 segment. Basilar artery is patent, with normal flow related enhancement of the main branch vessels. Moderate tandem stenosis superior cerebellar arteries compatible with atherosclerosis. Patent posterior cerebral arteries. Tandem severe stenosis bilateral posterior cerebral arteries occluded RIGHT P1 segment with minimal distal reconstitution. Potentially occluded LEFT P2 and or P3 segments No aneurysm. ANATOMIC VARIANTS: None. Source images and MIP images were reviewed. IMPRESSION: MRA head: 1. RIGHT P1 occlusion with minimal reconstitution. Potentially occluded LEFT P2 and/or P 3 segments. 2. Multifocal severe stenosis cerebral arteries, preponderance within posterior cerebral arteries. MRA neck: 1. 60% stenosis RIGHT internal carotid artery. 2. 50% stenosis LEFT internal carotid artery. Electronically Signed   By: Awilda Metro M.D.   On: 06/01/2017 20:05   Mr Brain Wo Contrast  Result Date: 06/01/2017 CLINICAL DATA:  Numbness to the LEFT side of the face upon awakening. Facial droop, and LEFT upper extremity weakness. EXAM: MRI HEAD WITHOUT CONTRAST TECHNIQUE: Multiplanar, multiecho pulse sequences of the brain and surrounding structures were obtained without intravenous contrast. COMPARISON:  Code stroke CT 06/01/2017. MR head 02/12/2011. CT head 08/01/2013. FINDINGS: The patient was unable to remain motionless for the  exam. Small or subtle lesions could be overlooked. Brain: In the RIGHT mid pons, there is a paramedian focus of restricted diffusion, approximate 3 x 7 mm cross-section, corresponding low ADC, consistent with acute infarction. No other areas of acute infarction are identified. No hemorrhage, mass lesion, hydrocephalus, or extra-axial fluid. Advanced cerebral and cerebellar atrophy. Extensive T2 and FLAIR hyperintensities throughout the white matter, consistent with small vessel disease. Small vessel microangiopathy changes are also observed in the brainstem, although there is no discrete lacunar infarct to correlate with the RIGHT lower pontine CT hypodensity. Vascular: Normal flow voids. Skull and upper cervical spine: Normal marrow signal. Sinuses/Orbits: Negative. Other: None. Compared with 2012, there is progression of atrophy and small vessel disease. The acute brainstem infarct on today's MR is not visible on CT. IMPRESSION: Acute RIGHT paramedian pontine infarct, nonhemorrhagic. Atrophy and small vessel disease progressive from 2012. Electronically Signed   By: Elsie Stain M.D.   On: 06/01/2017 12:30   Mr [name] Head Wo Contrast  Result Date: 06/01/2017 CLINICAL DATA:  Follow-up stroke. EXAM: MRA NECK WITHOUT AND WITH CONTRAST MRA HEAD WITHOUT CONTRAST TECHNIQUE: Multiplanar and multiecho  pulse sequences of the neck were obtained without and with intravenous contrast. Angiographic images of the neck were obtained using MRA technique without and with intravenous contrast; Angiographic images of the Circle of [place] were obtained using MRA technique without intravenous contrast. CONTRAST:  10mL MULTIHANCE GADOBENATE DIMEGLUMINE 529 MG/ML IV SOLN COMPARISON:  MRI of the head June 01, 2017 FINDINGS: MRA NECK FINDINGS ANTERIOR CIRCULATION: The common carotid arteries are widely patent bilaterally. 60% stenosis RIGHT internal carotid artery origin by NASCET criteria. Moderate luminal irregularity RIGHT internal  carotid artery seen with atherosclerosis or fibromuscular dysplasia. 50% stenosis LEFT internal carotid artery origin by NASCET criteria with mild luminal irregularity mid cervical internal carotid artery seen with fibromuscular dysplasia or atherosclerosis. No flow limiting stenosis. POSTERIOR CIRCULATION: Bilateral vertebral arteries are patent to the vertebrobasilar junction. Mild stenosis LEFT vertebral artery origin. No flow limiting stenosis or luminal irregularity. Source images and MIP images were reviewed. MRA HEAD FINDINGS-Mild motion degraded examination. ANTERIOR CIRCULATION: Normal flow related enhancement of the included cervical, petrous, cavernous and supraclinoid internal carotid arteries. Patent anterior communicating artery. Patent anterior and middle cerebral arteries, severe stenosis distal RIGHT M1 segment severe stenosis proximal RIGHT M2 superior and inferior divisions. Severe stenosis LEFT A2 3 junction. Moderate stenosis LEFT M2 superior division and LEFT M3 segment. No large vessel occlusion nor aneurysm. POSTERIOR CIRCULATION: RIGHT vertebral artery is dominant. Tandem moderate stenosis LEFT V4 segment. Basilar artery is patent, with normal flow related enhancement of the main branch vessels. Moderate tandem stenosis superior cerebellar arteries compatible with atherosclerosis. Patent posterior cerebral arteries. Tandem severe stenosis bilateral posterior cerebral arteries occluded RIGHT P1 segment with minimal distal reconstitution. Potentially occluded LEFT P2 and or P3 segments No aneurysm. ANATOMIC VARIANTS: None. Source images and MIP images were reviewed. IMPRESSION: MRA head: 1. RIGHT P1 occlusion with minimal reconstitution. Potentially occluded LEFT P2 and/or P 3 segments. 2. Multifocal severe stenosis cerebral arteries, preponderance within posterior cerebral arteries. MRA neck: 1. 60% stenosis RIGHT internal carotid artery. 2. 50% stenosis LEFT internal carotid artery.  Electronically Signed   By: Awilda Metro M.D.   On: 06/01/2017 20:05   Ct Head Code Stroke Wo Contrast  Addendum Date: 06/01/2017   ADDENDUM REPORT: 06/01/2017 11:18 ADDENDUM: Study discussed by telephone with Dr. Jomarie Longs ZAMMIT on 06/01/2017 at 1036 hours. Electronically Signed   By: Odessa Fleming M.D.   On: 06/01/2017 11:18   Result Date: 06/01/2017 CLINICAL DATA:  Code stroke. 84 year old female with facial droop, left upper extremity weakness, left facial numbness. EXAM: CT HEAD WITHOUT CONTRAST TECHNIQUE: Contiguous axial images were obtained from the base of the skull through the vertex without intravenous contrast. COMPARISON:  Head CT without contrast 08/01/2013 and brain MRI 02/12/2011. FINDINGS: Brain: Stable cerebral volume since 2015. Chronic Patchy and confluent bilateral cerebral white matter hypodensity. Hypodensity in the right paracentral pons appears stable since 2015, but new since the prior MRI. No acute intracranial hemorrhage identified. No cortically based acute infarct identified. No midline shift, mass effect, or evidence of intracranial mass lesion. Stable ventricle size and configuration. Chronic dystrophic calcification in the left cerebellum is stable. Vascular: Calcified atherosclerosis at the skull base. No suspicious intracranial vascular hyperdensity. Skull: Stable.  No acute osseous abnormality identified. Sinuses/Orbits: Visualized paranasal sinuses and mastoids are stable and well pneumatized. Other: No acute orbit or scalp soft tissue finding. ASPECTS [facility] Stroke Program Early CT Score) - Ganglionic level infarction (caudate, lentiform nuclei, internal capsule, insula, M1-M3 cortex): 7 - Supraganglionic infarction (M4-M6 cortex): 3  Total score (0-10 with 10 being normal): 10 IMPRESSION: 1. No acute intracranial hemorrhage or acute cortically based infarct identified. ASPECTS is 10. 2. Chronically advanced small vessel disease suspected, but appears stable by CT since 2015.  Electronically Signed: By: Odessa Fleming M.D. On: 06/01/2017 10:35        Scheduled Meds: . aspirin  81 mg Oral Daily  . atorvastatin  80 mg Oral q1800  . enoxaparin (LOVENOX) injection  40 mg Subcutaneous Q24H   Continuous Infusions:   LOS: 1 day    Time spent: 25 minutes    Alberteen Sam, MD [facility] 06/02/2017, 11 AM    Pager [phone] --- please page though AMION:  [email or web address] If 7PM-7AM, please contact night-coverage

## 2017-06-02 NOTE — Plan of Care (Signed)
  Problem: Education: Goal: Knowledge of General Education information will improve Outcome: Progressing   Problem: Health Behavior/Discharge Planning: Goal: Ability to manage health-related needs will improve Outcome: Progressing   Problem: Clinical Measurements: Goal: Ability to maintain clinical measurements within normal limits will improve Outcome: Progressing Goal: Will remain free from infection Outcome: Progressing Goal: Diagnostic test results will improve Outcome: Progressing Goal: Respiratory complications will improve Outcome: Progressing Goal: Cardiovascular complication will be avoided Outcome: Progressing   Problem: Activity: Goal: Risk for activity intolerance will decrease Outcome: Progressing   Problem: Nutrition: Goal: Adequate nutrition will be maintained Outcome: Progressing   Problem: Coping: Goal: Level of anxiety will decrease Outcome: Progressing   Problem: Elimination: Goal: Will not experience complications related to bowel motility Outcome: Progressing Goal: Will not experience complications related to urinary retention Outcome: Progressing   Problem: Pain Managment: Goal: General experience of comfort will improve Outcome: Progressing   Problem: Safety: Goal: Ability to remain free from injury will improve Outcome: Progressing   Problem: Skin Integrity: Goal: Risk for impaired skin integrity will decrease Outcome: Progressing   Problem: Education: Goal: Knowledge of disease or condition will improve Outcome: Progressing Goal: Knowledge of secondary prevention will improve Outcome: Progressing Goal: Knowledge of patient specific risk factors addressed and post discharge goals established will improve Outcome: Progressing   Problem: Coping: Goal: Will verbalize positive feelings about self Outcome: Progressing   Problem: Self-Care: Goal: Ability to participate in self-care as condition permits will improve Outcome:  Progressing   Problem: Ischemic Stroke/TIA Tissue Perfusion: Goal: Complications of ischemic stroke/TIA will be minimized Outcome: Progressing

## 2017-06-02 NOTE — Progress Notes (Addendum)
STROKE TEAM PROGRESS NOTE   SUBJECTIVE (INTERVAL HISTORY) Her pastor and friend are at the bedside.  She enjoyed her lunch. No neuro worsening since seen by neuro earlier.    OBJECTIVE Vitals:   06/02/17 0050 06/02/17 0250 06/02/17 0450 06/02/17 0806  BP: (!) 154/68 (!) 115/56 130/66 (!) 164/66  Pulse: 61 60 (!) 58 66  Resp: 16 16 16 18   Temp: 98.2 F (36.8 C) (!) 97.5 F (36.4 C) 97.7 F (36.5 C)   TempSrc: Oral Oral Oral   SpO2: 98% 97% 97% 98%  Weight:      Height:        CBC:  Recent Labs  Lab 06/01/17 1054 06/02/17 0403  WBC 6.5 6.8  NEUTROABS 3.4  --   HGB 12.4 11.8*  HCT 38.4 36.4  MCV 94.8 93.6  PLT 192 179    Basic Metabolic Panel:  Recent Labs  Lab 06/01/17 1047 06/01/17 1054 06/01/17 2147  NA 143 143  --   K 3.6 3.7  --   CL 104 105  --   CO2  --  27  --   GLUCOSE 105* 106*  --   BUN 26* 27*  --   CREATININE 0.70 0.86 0.75  CALCIUM  --  9.3  --     Lipid Panel:     Component Value Date/Time   CHOL 280 (H) 06/02/2017 0404   TRIG 172 (H) 06/02/2017 0404   HDL 49 06/02/2017 0404   CHOLHDL 5.7 06/02/2017 0404   VLDL 34 06/02/2017 0404   LDLCALC 197 (H) 06/02/2017 0404   HgbA1c:  Lab Results  Component Value Date   HGBA1C 5.6 06/02/2017   Urine Drug Screen:     Component Value Date/Time   LABOPIA NONE DETECTED 06/01/2017 1016   COCAINSCRNUR NONE DETECTED 06/01/2017 1016   LABBENZ NONE DETECTED 06/01/2017 1016   AMPHETMU NONE DETECTED 06/01/2017 1016   THCU NONE DETECTED 06/01/2017 1016   LABBARB NONE DETECTED 06/01/2017 1016    Alcohol Level     Component Value Date/Time   ETH <10 06/01/2017 2207    IMAGING  Dg Chest 2 View  Result Date: 06/01/2017 CLINICAL DATA:  Stroke. EXAM: CHEST - 2 VIEW COMPARISON:  09/06/2013 FINDINGS: The cardiomediastinal contours are unchanged, borderline cardiomegaly. Aortic atherosclerosis. The lungs are clear. Pulmonary vasculature is normal. No consolidation, pleural effusion, or pneumothorax.  No acute osseous abnormalities are seen. The bones are under mineralized. IMPRESSION: Borderline cardiomegaly without acute chest finding. Aortic Atherosclerosis (ICD10-I70.0). Electronically Signed   By: Rubye Oaks M.D.   On: 06/01/2017 23:13   Mr [name] Neck W Contrast  Result Date: 06/01/2017 CLINICAL DATA:  Follow-up stroke. EXAM: MRA NECK WITHOUT AND WITH CONTRAST MRA HEAD WITHOUT CONTRAST TECHNIQUE: Multiplanar and multiecho pulse sequences of the neck were obtained without and with intravenous contrast. Angiographic images of the neck were obtained using MRA technique without and with intravenous contrast; Angiographic images of the Circle of Willis were obtained using MRA technique without intravenous contrast. CONTRAST:  10mL MULTIHANCE GADOBENATE DIMEGLUMINE 529 MG/ML IV SOLN COMPARISON:  MRI of the head June 01, 2017 FINDINGS: MRA NECK FINDINGS ANTERIOR CIRCULATION: The common carotid arteries are widely patent bilaterally. 60% stenosis RIGHT internal carotid artery origin by NASCET criteria. Moderate luminal irregularity RIGHT internal carotid artery seen with atherosclerosis or fibromuscular dysplasia. 50% stenosis LEFT internal carotid artery origin by NASCET criteria with mild luminal irregularity mid cervical internal carotid artery seen with fibromuscular dysplasia or atherosclerosis. No flow  limiting stenosis. POSTERIOR CIRCULATION: Bilateral vertebral arteries are patent to the vertebrobasilar junction. Mild stenosis LEFT vertebral artery origin. No flow limiting stenosis or luminal irregularity. Source images and MIP images were reviewed. MRA HEAD FINDINGS-Mild motion degraded examination. ANTERIOR CIRCULATION: Normal flow related enhancement of the included cervical, petrous, cavernous and supraclinoid internal carotid arteries. Patent anterior communicating artery. Patent anterior and middle cerebral arteries, severe stenosis distal RIGHT M1 segment severe stenosis proximal RIGHT M2  superior and inferior divisions. Severe stenosis LEFT A2 3 junction. Moderate stenosis LEFT M2 superior division and LEFT M3 segment. No large vessel occlusion nor aneurysm. POSTERIOR CIRCULATION: RIGHT vertebral artery is dominant. Tandem moderate stenosis LEFT V4 segment. Basilar artery is patent, with normal flow related enhancement of the main branch vessels. Moderate tandem stenosis superior cerebellar arteries compatible with atherosclerosis. Patent posterior cerebral arteries. Tandem severe stenosis bilateral posterior cerebral arteries occluded RIGHT P1 segment with minimal distal reconstitution. Potentially occluded LEFT P2 and or P3 segments No aneurysm. ANATOMIC VARIANTS: None. Source images and MIP images were reviewed. IMPRESSION: MRA head: 1. RIGHT P1 occlusion with minimal reconstitution. Potentially occluded LEFT P2 and/or P 3 segments. 2. Multifocal severe stenosis cerebral arteries, preponderance within posterior cerebral arteries. MRA neck: 1. 60% stenosis RIGHT internal carotid artery. 2. 50% stenosis LEFT internal carotid artery. Electronically Signed   By: Awilda Metro M.D.   On: 06/01/2017 20:05   Mr Brain Wo Contrast  Result Date: 06/01/2017 CLINICAL DATA:  Numbness to the LEFT side of the face upon awakening. Facial droop, and LEFT upper extremity weakness. EXAM: MRI HEAD WITHOUT CONTRAST TECHNIQUE: Multiplanar, multiecho pulse sequences of the brain and surrounding structures were obtained without intravenous contrast. COMPARISON:  Code stroke CT 06/01/2017. MR head 02/12/2011. CT head 08/01/2013. FINDINGS: The patient was unable to remain motionless for the exam. Small or subtle lesions could be overlooked. Brain: In the RIGHT mid pons, there is a paramedian focus of restricted diffusion, approximate 3 x 7 mm cross-section, corresponding low ADC, consistent with acute infarction. No other areas of acute infarction are identified. No hemorrhage, mass lesion, hydrocephalus, or  extra-axial fluid. Advanced cerebral and cerebellar atrophy. Extensive T2 and FLAIR hyperintensities throughout the white matter, consistent with small vessel disease. Small vessel microangiopathy changes are also observed in the brainstem, although there is no discrete lacunar infarct to correlate with the RIGHT lower pontine CT hypodensity. Vascular: Normal flow voids. Skull and upper cervical spine: Normal marrow signal. Sinuses/Orbits: Negative. Other: None. Compared with 2012, there is progression of atrophy and small vessel disease. The acute brainstem infarct on today's MR is not visible on CT. IMPRESSION: Acute RIGHT paramedian pontine infarct, nonhemorrhagic. Atrophy and small vessel disease progressive from 2012. Electronically Signed   By: Elsie Stain M.D.   On: 06/01/2017 12:30   Mr [name] Head Wo Contrast  Result Date: 06/01/2017 CLINICAL DATA:  Follow-up stroke. EXAM: MRA NECK WITHOUT AND WITH CONTRAST MRA HEAD WITHOUT CONTRAST TECHNIQUE: Multiplanar and multiecho pulse sequences of the neck were obtained without and with intravenous contrast. Angiographic images of the neck were obtained using MRA technique without and with intravenous contrast; Angiographic images of the Circle of Willis were obtained using MRA technique without intravenous contrast. CONTRAST:  10mL MULTIHANCE GADOBENATE DIMEGLUMINE 529 MG/ML IV SOLN COMPARISON:  MRI of the head June 01, 2017 FINDINGS: MRA NECK FINDINGS ANTERIOR CIRCULATION: The common carotid arteries are widely patent bilaterally. 60% stenosis RIGHT internal carotid artery origin by NASCET criteria. Moderate luminal irregularity RIGHT internal carotid artery  seen with atherosclerosis or fibromuscular dysplasia. 50% stenosis LEFT internal carotid artery origin by NASCET criteria with mild luminal irregularity mid cervical internal carotid artery seen with fibromuscular dysplasia or atherosclerosis. No flow limiting stenosis. POSTERIOR CIRCULATION: Bilateral  vertebral arteries are patent to the vertebrobasilar junction. Mild stenosis LEFT vertebral artery origin. No flow limiting stenosis or luminal irregularity. Source images and MIP images were reviewed. MRA HEAD FINDINGS-Mild motion degraded examination. ANTERIOR CIRCULATION: Normal flow related enhancement of the included cervical, petrous, cavernous and supraclinoid internal carotid arteries. Patent anterior communicating artery. Patent anterior and middle cerebral arteries, severe stenosis distal RIGHT M1 segment severe stenosis proximal RIGHT M2 superior and inferior divisions. Severe stenosis LEFT A2 3 junction. Moderate stenosis LEFT M2 superior division and LEFT M3 segment. No large vessel occlusion nor aneurysm. POSTERIOR CIRCULATION: RIGHT vertebral artery is dominant. Tandem moderate stenosis LEFT V4 segment. Basilar artery is patent, with normal flow related enhancement of the main branch vessels. Moderate tandem stenosis superior cerebellar arteries compatible with atherosclerosis. Patent posterior cerebral arteries. Tandem severe stenosis bilateral posterior cerebral arteries occluded RIGHT P1 segment with minimal distal reconstitution. Potentially occluded LEFT P2 and or P3 segments No aneurysm. ANATOMIC VARIANTS: None. Source images and MIP images were reviewed. IMPRESSION: MRA head: 1. RIGHT P1 occlusion with minimal reconstitution. Potentially occluded LEFT P2 and/or P 3 segments. 2. Multifocal severe stenosis cerebral arteries, preponderance within posterior cerebral arteries. MRA neck: 1. 60% stenosis RIGHT internal carotid artery. 2. 50% stenosis LEFT internal carotid artery. Electronically Signed   By: Awilda Metro M.D.   On: 06/01/2017 20:05   Ct Head Code Stroke Wo Contrast  Addendum Date: 06/01/2017   ADDENDUM REPORT: 06/01/2017 11:18 ADDENDUM: Study discussed by telephone with Dr. Jomarie Longs ZAMMIT on 06/01/2017 at 1036 hours. Electronically Signed   By: Odessa Fleming M.D.   On: 06/01/2017  11:18   Result Date: 06/01/2017 CLINICAL DATA:  Code stroke. 84 year old female with facial droop, left upper extremity weakness, left facial numbness. EXAM: CT HEAD WITHOUT CONTRAST TECHNIQUE: Contiguous axial images were obtained from the base of the skull through the vertex without intravenous contrast. COMPARISON:  Head CT without contrast 08/01/2013 and brain MRI 02/12/2011. FINDINGS: Brain: Stable cerebral volume since 2015. Chronic Patchy and confluent bilateral cerebral white matter hypodensity. Hypodensity in the right paracentral pons appears stable since 2015, but new since the prior MRI. No acute intracranial hemorrhage identified. No cortically based acute infarct identified. No midline shift, mass effect, or evidence of intracranial mass lesion. Stable ventricle size and configuration. Chronic dystrophic calcification in the left cerebellum is stable. Vascular: Calcified atherosclerosis at the skull base. No suspicious intracranial vascular hyperdensity. Skull: Stable.  No acute osseous abnormality identified. Sinuses/Orbits: Visualized paranasal sinuses and mastoids are stable and well pneumatized. Other: No acute orbit or scalp soft tissue finding. ASPECTS [facility] Stroke Program Early CT Score) - Ganglionic level infarction (caudate, lentiform nuclei, internal capsule, insula, M1-M3 cortex): 7 - Supraganglionic infarction (M4-M6 cortex): 3 Total score (0-10 with 10 being normal): 10 IMPRESSION: 1. No acute intracranial hemorrhage or acute cortically based infarct identified. ASPECTS is 10. 2. Chronically advanced small vessel disease suspected, but appears stable by CT since 2015. Electronically Signed: By: Odessa Fleming M.D. On: 06/01/2017 10:35    PHYSICAL EXAM HEENT: [place]/AT Lungs: Respirations unlabored Ext: Warm and well perfused  Neurologic Examination: Mental Status: Alert and fully oriented. Speech fluent. Had difficulty with a 2-step directional command; otherwise comprehension intact.   Cranial Nerves: II:  Visual fields intact.  PERRL.  III,IV, VI: No ptosis. EOMI.  V,VII: Left facial droop. temp sensation intact bilaterally VIII: hearing intact to voice IX,X: Palate rises symmetrically. Mild hypophonia.  XI: Symmetric XII: midline tongue extension  Motor: LUE: 4/5  RUE: 5/5 LLE: 4/5 RLE: 5/5 Left upper extremity drift. Right arm orbits left Sensory: Temp and light touch intact with limbs tested individually Cerebellar: No ataxia with FNF bilaterally.  Gait: Deferred   ASSESSMENT/PLAN Alexandra Mckee is a 84 y.o. female with history of HTN, HLD and hypokalemia presenting with Left facial numbness, LUE weakness and facial droop.   Stroke:   Right paramedian pontine infarct secondary to small vessel disease source  Resultant  Left hemiparesis  Code Stroke CT head No acute stroke. Small vessel disease. ASPECTS 10.   MRI head R paramedian pontine infarct. Small vessel disease. Atrophy.   MRA head R P1 occlusion, possible occlded L P2 and/or P3. Multifocal severe cerebral artery stenosis  MRA neck R ICA 60%, L ICA 50% stenosis  2D Echo  pending   LDL 197  HgbA1c 5.6  Lovenox 40 mg sq daily for VTE prophylaxis  Fall precautions  Diet Heart Room service appropriate? Yes; Fluid consistency: Thin  No antithrombotic prior to admission, given aspirin 324 yesterday, now on aspirin 81 mg daily. Given mild stroke, will place on aspirin 81 mg and plavix 75 mg daily x 3 weeks, then aspirin alone. Orders adjusted.   Patient counseled to be compliant with her antithrombotic medications  Ongoing aggressive stroke risk factor management  Therapy recommendations:  Home health PT, OT and SLP  Disposition:  return home  Hypertension  Stable . Permissive hypertension (OK if < 220/120) but gradually normalize in 5-7 days . Long-term BP goal normotensive  Hyperlipidemia  Home meds:  No statin  LDL 197, goal < 70  Now on lipitor 80 mg daily  Continue  statin at discharge  Other Stroke Risk Factors  Advanced age  UDS / ETOH level negative  Other Active Problems  Pyuria. Urine culture pending  Hypokalemia  Hospital day # 1  Annie MainSharon Biby, MSN, APRN, ANVP-BC, AGPCNP-BC Advanced Practice Stroke Nurse [facility] See Amion for Schedule & Pager information 06/02/2017 4:18 PM   ATTENDING NOTE: I reviewed above note and agree with the assessment and plan. I have made any additions or clarifications directly to the above note. Pt was seen and examined.   84 year old female with history of hypertension admitted for left-sided facial droop and  left-sided weakness.  MRI showed right pontine small infarct.  MRA head and neck diffuse vascular atherosclerosis including right ICA 60%, left ICA 50% stenosis, right P1 occlusion, and left P2/P3 occlusion.  EF 60-65%.  LDL 197 and A1c 5.6.  UDS negative.   Exam showed mild left facial droop, left upper extremity pronator drift with left upper and lower extremity 4/5 strength.  Otherwise unremarkable.  Patient stroke consider small vessel disease.  Recommend dual antiplatelet with aspirin 81 and Plavix for 3 weeks and then Plavix alone.  Continue Lipitor 80 on discharge.  PT OT recommend home health PT/OT.  Neurology will sign off. Please call with questions. [facility]. Thanks for the consult.   Marvel PlanJindong Staisha Winiarski, MD PhD Stroke Neurology 06/02/2017 10:15 PM     To contact Stroke Continuity provider, please refer to [email or web address]. After hours, contact General Neurology

## 2017-06-02 NOTE — Progress Notes (Signed)
  Echocardiogram 2D Echocardiogram has been performed.  Alexandra Mckee 06/02/2017, 2:27 PM

## 2017-06-02 NOTE — Plan of Care (Signed)
  Problem: Clinical Measurements: Goal: Respiratory complications will improve 06/02/2017 0145 by Ashley RoyaltyEdoh, Karinne Schmader I, RN Outcome: Progressing 06/02/2017 0141 by Ashley RoyaltyEdoh, Alexandro Line I, RN Outcome: Progressing

## 2017-06-02 NOTE — Progress Notes (Signed)
PT Cancellation Note  Patient Details Name: Alexandra Mckee MRN: [id number] DOB: 1921-05-05   Cancelled Treatment:    Reason Eval/Treat Not Completed: Other (comment) Attempted to evaluate patient, she is currently occupied with other clinicians and RN reports she is also very fatigued/worn out from the morning. Plan to try to attempt to return later in the day if/as schedule allows.    Nedra HaiKristen Kylo Gavin PT, DPT, CBIS  Supplemental Physical Therapist [facility]   Pager [phone]

## 2017-06-02 NOTE — Evaluation (Signed)
Occupational Therapy Evaluation Patient Details Name: Alexandra Mckee MRN: [id number] DOB: 08-Sep-1921 Today's Date: 06/02/2017    History of Present Illness Pt is a 84yo female with h/o small vessle disease who was admitted with c/o perioral numbness and L facial droop as well as weakness. Pt was found to have  R paramedial pontine infarct.    Clinical Impression   Pt admitted with the above diagnosis and has the deficits listed below. Feel pt would benefit from cont OT to increase strength and functional use of LUE as well as increase independence with basic adls and adl transfers so she can safely life in assisted living at [facility].  Pt currently on assisted living unit and if min/mod assist +supervision is available, feel she could return there.  Pt previously was independent on this unit only needed assist for finances and meds.    Follow Up Recommendations  Home health OT;Supervision/Assistance - 24 hour;Other (comment)(HHOT at ALF)    Equipment Recommendations  3 in 1 bedside commode    Recommendations for Other Services       Precautions / Restrictions Precautions Precautions: Fall Precaution Comments: Pt with L neglect Restrictions Weight Bearing Restrictions: No      Mobility Bed Mobility               General bed mobility comments: pt in chair on arrival  Transfers Overall transfer level: Needs assistance Equipment used: Rolling walker (2 wheeled);1 person hand held assist Transfers: Sit to/from [employer] Transfers Sit to Stand: Mod assist(from low surface) Stand pivot transfers: Min assist       General transfer comment: Pt requires cues to attend to L side.    Balance Overall balance assessment: Needs assistance Sitting-balance support: Feet supported;Single extremity supported Sitting balance-Leahy Scale: Poor Sitting balance - Comments: Pt unable to sit on EOB to feed self. Pt required the assist of the chair for trunk support. Postural  control: Posterior lean;Left lateral lean Standing balance support: During functional activity;Bilateral upper extremity supported Standing balance-Leahy Scale: Poor Standing balance comment: Pt required significant outside support to remain standing.                           ADL either performed or assessed with clinical judgement   ADL Overall ADL's : Needs assistance/impaired Eating/Feeding: Set up;Sitting Eating/Feeding Details (indicate cue type and reason): assist to cut food. Cues to not pocket on the left. Grooming: Wash/dry hands;Wash/dry face;Oral care;Moderate assistance;Standing;Cueing for sequencing;Cueing for safety Grooming Details (indicate cue type and reason): Pt with neglect of L side of sink and L arm. Pt requires assist to problem solve how to do grooming tasks with decreased use of LUE. Pts balance at sink impaired requiring sink to lean on and assist from therapist on the Left. Upper Body Bathing: Minimal assistance;Sitting;Cueing for sequencing   Lower Body Bathing: Moderate assistance;Sit to/from stand;Cueing for compensatory techniques;Cueing for safety Lower Body Bathing Details (indicate cue type and reason): most assist needed in standing due to poor balance and decreased awareness of L side. Upper Body Dressing : Moderate assistance;Sitting;Cueing for sequencing Upper Body Dressing Details (indicate cue type and reason): L neglect Lower Body Dressing: Moderate assistance;Sit to/from stand;Cueing for sequencing;Cueing for compensatory techniques Lower Body Dressing Details (indicate cue type and reason): Pt required assist finding L side of pants and assist in standing due to poor balance and awareness of poor balance. Toilet Transfer: Moderate assistance;Ambulation;Grab bars;RW;Comfort height toilet Toilet Transfer  Details (indicate cue type and reason): Pt walked to bathroom running into things on her left and was unable to find toilet on her left once  she was in the bathroom.  Pt with safety concerns when using walker due to decreased awareness of L environment. Toileting- Clothing Manipulation and Hygiene: Moderate assistance;Sit to/from stand;Cueing for compensatory techniques;Cueing for safety       Functional mobility during ADLs: Moderate assistance;Rolling walker General ADL Comments: Pt does better with adls when vcs and set up is provided due to L neglect. Pt is a fall risk due to decreased awarness of her deficits.     Vision Baseline Vision/History: Wears glasses Wears Glasses: At all times(but mostly for reading) Patient Visual Report: No change from baseline Vision Assessment?: Vision impaired- to be further tested in functional context     Perception Perception Perception Tested?: Yes Perception Deficits: Inattention/neglect Inattention/Neglect: Does not attend to left visual field;Does not attend to left side of body   Praxis Praxis Praxis tested?: Not tested    Pertinent Vitals/Pain Pain Assessment: No/denies pain     Hand Dominance Right   Extremity/Trunk Assessment Upper Extremity Assessment Upper Extremity Assessment: LUE deficits/detail LUE Deficits / Details: ROM WFL.  Strength:  shoulder 4/5, biceps/triceps 4/5, wrist and hand 4-/5 LUE Coordination: decreased gross motor;decreased fine motor   Lower Extremity Assessment Lower Extremity Assessment: Defer to PT evaluation       Communication Communication Communication: (slightly dysarthric)   Cognition Arousal/Alertness: Awake/alert Behavior During Therapy: WFL for tasks assessed/performed Overall Cognitive Status: Impaired/Different from baseline Area of Impairment: Orientation;Memory;Safety/judgement;Awareness;Problem solving                 Orientation Level: Time   Memory: Decreased recall of precautions;Decreased short-term memory   Safety/Judgement: Decreased awareness of safety;Decreased awareness of deficits Awareness:  Emergent Problem Solving: Difficulty sequencing;Requires tactile cues General Comments: Pt is a safety concern due to decreased insight and awareness of the L side of her body and environment as well as decreased insight to her deficits.   General Comments  Pt aware that things were not the same but not aware of how significant her perception has been affected.    Exercises     Shoulder Instructions      Home Living Family/patient expects to be discharged to:: Assisted living                             Home Equipment: None   Additional Comments: Pt may benefit from walker, shower chair and elevated commode      Prior Functioning/Environment Level of Independence: Independent        Comments: Pt walked without an assistive device all over assisted living and completed all adls on her own.  Only thing pt required assist with is finances, meds and pt does not drive.        OT Problem List: Decreased strength;Impaired balance (sitting and/or standing);Impaired vision/perception;Decreased coordination;Decreased cognition;Decreased safety awareness;Decreased knowledge of use of DME or AE;Decreased knowledge of precautions;Impaired UE functional use      OT Treatment/Interventions:      OT Goals(Current goals can be found in the care plan section) Acute Rehab OT Goals Patient Stated Goal: to get well OT Goal Formulation: With patient Time For Goal Achievement: 06/16/17 Potential to Achieve Goals: Good ADL Goals Pt Will Perform Eating: with supervision;sitting Pt Will Perform Grooming: with supervision;standing Pt Will Perform Lower Body Bathing: with min guard  assist;sit to/from stand Pt Will Perform Lower Body Dressing: with min assist;sit to/from stand Pt Will Transfer to Toilet: with supervision;ambulating Pt Will Perform Toileting - Clothing Manipulation and hygiene: with supervision;sit to/from stand Additional ADL Goal #1: Pt will locate adls items in her L  environment with minimal VCs while grooming.  OT Frequency: Min 2X/week   Barriers to D/C:    [facility]       Co-evaluation              AM-PAC PT "6 Clicks" Daily Activity     Outcome Measure Help from another person eating meals?: A Little Help from another person taking care of personal grooming?: A Little Help from another person toileting, which includes using toliet, bedpan, or urinal?: A Little Help from another person bathing (including washing, rinsing, drying)?: A Little Help from another person to put on and taking off regular upper body clothing?: A Little Help from another person to put on and taking off regular lower body clothing?: A Lot 6 Click Score: 17   End of Session Equipment Utilized During Treatment: Rolling walker Nurse Communication: Mobility status  Activity Tolerance: Patient tolerated treatment well Patient left: in chair;with call bell/phone within reach;with chair alarm set  OT Visit Diagnosis: Unsteadiness on feet (R26.81);Other symptoms and signs involving the nervous system (R29.898);Hemiplegia and hemiparesis;Muscle weakness (generalized) (M62.81) Hemiplegia - Right/Left: Left Hemiplegia - dominant/non-dominant: Non-Dominant Hemiplegia - caused by: Cerebral infarction                Time: 1011-1031 OT Time Calculation (min): 20 min Charges:  OT General Charges $OT Visit: 1 Visit OT Evaluation $OT Eval Moderate Complexity: 1 Mod G-Codes:     Tory EmeraldHolly Sarha Bartelt, OTR/L [phone]  Hope BuddsJones, Jaylenn Baiza Anne 06/02/2017, 10:53 AM

## 2017-06-02 NOTE — Evaluation (Signed)
Physical Therapy Evaluation Patient Details Name: Alexandra Mckee MRN: [id number] DOB: 02/07/1922 Today's Date: 06/02/2017   History of Present Illness  Pt is a 84yo female with h/o small vessle disease who was admitted with c/o perioral numbness and L facial droop as well as weakness. Pt was found to have  R paramedial pontine infarct. No TPA given. PMH HTN   Clinical Impression   Patient received in bed, very pleasant and willing to participate with PT today; family present at beginning of the session and assisted in providing PLOF/equipment history. Of note, at baseline patient was very independent and was able to perform self-care and ambulate throughout her facility with no device and without difficulty. She now requires ModA for functional bed mobility and safe functional transfers; did not attempt gait today due to significant fatigue this afternoon. She was left up in the chair with all needs met, chair alarm set, and all needs otherwise met, RN aware of patient status. She will continue to strongly benefit from skilled PT services in the acute setting, and should be able to receive HHPT at her assistive living facility as long as they are able to provide appropriate levels of assistance.     Follow Up Recommendations Home health PT;Supervision/Assistance - 24 hour;Other (comment)(HHPT at ALF )    Equipment Recommendations  Rolling walker with 5" wheels;3in1 (PT);Other (comment)(may consider WC if mobility/gait does not improve )    Recommendations for Other Services       Precautions / Restrictions Precautions Precautions: Fall Precaution Comments: Pt with L neglect Restrictions Weight Bearing Restrictions: No      Mobility  Bed Mobility Overal bed mobility: Needs Assistance Bed Mobility: Supine to Sit     Supine to sit: Mod assist     General bed mobility comments: ModA to bring trunk up and rotate hips to edge of bed, then required Min cues to maintain upright sitting,  tended to lose balance to R   Transfers Overall transfer level: Needs assistance Equipment used: 1 person hand held assist Transfers: Sit to/from [facility] Sit to Stand: Mod assist Stand pivot transfers: Mod assist       General transfer comment: ModA to come to a full stand, also ModA for safety/to maintain balance during stand pivot transfer today  Ambulation/Gait             General Gait Details: DNT/unable due to fatigue   Stairs            Wheelchair Mobility    Modified Rankin (Stroke Patients Only) Modified Rankin (Stroke Patients Only) Pre-Morbid Rankin Score: No symptoms Modified Rankin: Moderately severe disability     Balance Overall balance assessment: Needs assistance Sitting-balance support: Feet supported;Single extremity supported Sitting balance-Leahy Scale: Poor Sitting balance - Comments: able to sit EOB with Min guard to MinA to maintain upright, poor insight as to deficit and poor proprioception  Postural control: Posterior lean;Right lateral lean Standing balance support: During functional activity Standing balance-Leahy Scale: Poor Standing balance comment: reliant on external support                              Pertinent Vitals/Pain Pain Assessment: No/denies pain    Home Living Family/patient expects to be discharged to:: Assisted living     Type of Home: Apartment         Home Equipment: None Additional Comments: Pt may benefit from walker, shower chair and elevated  commode    Prior Function Level of Independence: Independent         Comments: Pt walked without an assistive device all over assisted living and completed all adls on her own.  Only thing pt required assist with is finances, meds and pt does not drive.     Hand Dominance   Dominant Hand: Right    Extremity/Trunk Assessment   Upper Extremity Assessment Upper Extremity Assessment: Defer to OT evaluation    Lower  Extremity Assessment Lower Extremity Assessment: Generalized weakness;LLE deficits/detail LLE Sensation: decreased proprioception LLE Coordination: decreased gross motor    Cervical / Trunk Assessment Cervical / Trunk Assessment: Normal  Communication   Communication: (mildly dysarthric )  Cognition Arousal/Alertness: Awake/alert Behavior During Therapy: WFL for tasks assessed/performed Overall Cognitive Status: Impaired/Different from baseline Area of Impairment: Orientation;Memory;Safety/judgement;Awareness;Problem solving                 Orientation Level: Time   Memory: Decreased recall of precautions;Decreased short-term memory   Safety/Judgement: Decreased awareness of safety;Decreased awareness of deficits Awareness: Emergent Problem Solving: Difficulty sequencing;Requires tactile cues General Comments: Pt is a safety concern due to decreased insight and awareness of the L side of her body and environment as well as decreased insight to her deficits.      General Comments General comments (skin integrity, edema, etc.): noted significant lack of insight into deficits and reduced proprioception     Exercises     Assessment/Plan    PT Assessment Patient needs continued PT services  PT Problem List Decreased strength;Decreased mobility;Decreased safety awareness;Decreased coordination;Decreased balance;Decreased knowledge of use of DME;Decreased activity tolerance       PT Treatment Interventions DME instruction;Therapeutic activities;Gait training;Therapeutic exercise;Patient/family education;Stair training;Balance training;Functional mobility training;Neuromuscular re-education    PT Goals (Current goals can be found in the Care Plan section)  Acute Rehab PT Goals Patient Stated Goal: to get well PT Goal Formulation: With patient Time For Goal Achievement: 06/16/17 Potential to Achieve Goals: Good    Frequency Min 5X/week   Barriers to discharge         Co-evaluation               AM-PAC PT "6 Clicks" Daily Activity  Outcome Measure Difficulty turning over in bed (including adjusting bedclothes, sheets and blankets)?: Unable Difficulty moving from lying on back to sitting on the side of the bed? : Unable Difficulty sitting down on and standing up from a chair with arms (e.g., wheelchair, bedside commode, etc,.)?: Unable Help needed moving to and from a bed to chair (including a wheelchair)?: A Lot Help needed walking in hospital room?: Total Help needed climbing 3-5 steps with a railing? : Total 6 Click Score: 7    End of Session Equipment Utilized During Treatment: Gait belt Activity Tolerance: Patient tolerated treatment well Patient left: in chair;with call bell/phone within reach;with chair alarm set Nurse Communication: Mobility status PT Visit Diagnosis: Unsteadiness on feet (R26.81);Muscle weakness (generalized) (M62.81);Other abnormalities of gait and mobility (R26.89);Difficulty in walking, not elsewhere classified (R26.2)    Time: 9371-6967 PT Time Calculation (min) (ACUTE ONLY): 24 min   Charges:   PT Evaluation $PT Eval Moderate Complexity: 1 Mod PT Treatments $Therapeutic Activity: 8-22 mins   PT G Codes:        Deniece Ree PT, DPT, CBIS  Supplemental Physical Therapist [place]   Pager [phone]

## 2017-06-02 NOTE — Evaluation (Signed)
Speech Language Pathology Evaluation Patient Details Name: Alexandra Mckee MRN: [id number] DOB: Dec 14, 1921 Today's Date: 06/02/2017 Time: [id number] SLP Time Calculation (min) (ACUTE ONLY): 17 min  Problem List:  Patient Active Problem List   Diagnosis Date Noted  . Stroke (HCC) 06/01/2017  . Memory loss 08/28/2013   Past Medical History:  Past Medical History:  Diagnosis Date  . Hyperlipidemia   . Hypertension   . Hypokalemia    Past Surgical History:  Past Surgical History:  Procedure Laterality Date  . ABDOMINAL HYSTERECTOMY     HPI:  84 y.o. female presenting with LUE and left facial droop. MRI obtained in the ED was positive for an acute small right pontine perforator ischemic infarction.    Assessment / Plan / Recommendation Clinical Impression  Pt presents with a mild dysarthria of speech, left inattention, mild deficits in memory/problem solving (potentially baseline but no family available).  Pt passed RN stroke swallow screen.  SLP will follow briefly for speech clarity, family education.     SLP Assessment  SLP Recommendation/Assessment: Patient needs continued Speech Lanaguage Pathology Services SLP Visit Diagnosis: Dysarthria and anarthria (R47.1)    Follow Up Recommendations  Other (comment)(tba)    Frequency and Duration min 1 x/week  1 week      SLP Evaluation Cognition  Overall Cognitive Status: Impaired/Different from baseline Arousal/Alertness: Awake/alert Orientation Level: Oriented to place;Oriented to person;Disoriented to time Attention: Sustained Sustained Attention: Appears intact Memory: Impaired Memory Impairment: Retrieval deficit Awareness: Impaired Awareness Impairment: Emergent impairment       Comprehension  Auditory Comprehension Overall Auditory Comprehension: Appears within functional limits for tasks assessed    Expression Expression Primary Mode of Expression: Verbal Verbal Expression Overall Verbal Expression: Appears  within functional limits for tasks assessed Written Expression Dominant Hand: Right   Oral / Motor  Oral Motor/Sensory Function Overall Oral Motor/Sensory Function: Mild impairment Facial ROM: Reduced left Facial Symmetry: Abnormal symmetry left;Suspected CN VII (facial) dysfunction Facial Strength: Reduced left Lingual Symmetry: Within Functional Limits Motor Speech Overall Motor Speech: Impaired Respiration: Within functional limits Phonation: Normal Resonance: Within functional limits Articulation: Impaired Level of Impairment: Conversation Intelligibility: Intelligible Motor Planning: Witnin functional limits   GO                    Blenda MountsCouture, Niasia Lanphear Laurice 06/02/2017, 11:33 AM

## 2017-06-02 NOTE — Progress Notes (Cosign Needed)
PROGRESS NOTE    Alexandra Mckee  WUJ:[id number] DOB: 06-28-1921 DOA: 06/01/2017 PCP: Tally Joe, MD  Outpatient Specialists:    Brief Narrative:  84 year old female with history of hypertension, hyperlipdemia, hypokalemia presented to ED with perioral numbess and left facial droop. Last known normal was sometime 4/10, patient noted perioral numbness in morning and family member noticed left sided facial droop with left sided weakness. MRI showed scute right paramedian pontine infarct, nonhemorrhagic, atrophy and small vessel disease progressive from 2012. Patient was admitted for stroke work up. MRA of head and neck showed right P1 occlusion with minimal reconstitution. Potentially occluded left P2 and/or P 3 segments, multifocal severe stenosis cerebral arteries, preponderance within posterior cerebral arteries. 60% stenosis right and 50% stenosis of left internal carotid arteries.  Assessment & Plan:   Acute Right paramedian pontine infarction: - continue ASA 81mg  - Atorvastatin 80 mg started - continue PT/OT/Speech - Echo - waiting for results  Pyuria: - no UTI symptoms, urine culture in process  HTN: - plan to restart HCTZ   Hypokalemia: - continue potassium, CBC ordered for am   DVT prophylaxis: Lovenox Code Status: DNR Family Communication: Sister at bedside Disposition Plan: Discharge when clinically stable   Consultants:   Neurology  Procedures:   Echocardiogram - waiting results  Antimicrobials:   None   Subjective: Patient is sitting up in chair, able to communicate regarding symptoms. Alert, oriented to person and place, not time.   Objective: Vitals:   06/02/17 0250 06/02/17 0450 06/02/17 0806 06/02/17 1225  BP: (!) 115/56 130/66 (!) 164/66 (!) 120/50  Pulse: 60 (!) 58 66 62  Resp: 16 16 18 18   Temp: (!) 97.5 F (36.4 C) 97.7 F (36.5 C)  97.8 F (36.6 C)  TempSrc: Oral Oral  Oral  SpO2: 97% 97% 98% 98%  Weight:      Height:         Intake/Output Summary (Last 24 hours) at 06/02/2017 1602 Last data filed at 06/02/2017 1400 Gross per 24 hour  Intake 960 ml  Output 650 ml  Net 310 ml   Filed Weights   06/01/17 1013  Weight: 58.1 kg (128 lb)    Examination:  General exam: Appears calm and comfortable, in no acute distress. Respiratory system: Clear to auscultation. Respiratory effort normal. Cardiovascular system: S1 & S2 heard, RRR. No peripheral edema.  Gastrointestinal system: Abdomen is nondistended, soft and nontender.  Central nervous system: Alert and oriented. Noticed left facial droop. Extremities: 3/5 LUE strength, 5/5 RUE strength. 4/5 LLE strength, 5/5 RLE strength Skin: No rashes, lesions or ulcers Psychiatry: Judgement and insight appear normal. Mood & affect appropriate. Alert, orientated to person and place.    Data Reviewed: I have personally reviewed following labs and imaging studies  CBC: Recent Labs  Lab 06/01/17 1047 06/01/17 1054 06/02/17 0403  WBC  --  6.5 6.8  NEUTROABS  --  3.4  --   HGB 12.2 12.4 11.8*  HCT 36.0 38.4 36.4  MCV  --  94.8 93.6  PLT  --  192 179   Basic Metabolic Panel: Recent Labs  Lab 06/01/17 1047 06/01/17 1054 06/01/17 2147  NA 143 143  --   K 3.6 3.7  --   CL 104 105  --   CO2  --  27  --   GLUCOSE 105* 106*  --   BUN 26* 27*  --   CREATININE 0.70 0.86 0.75  CALCIUM  --  9.3  --  GFR: Estimated Creatinine Clearance: 38.7 mL/min (by C-G formula based on SCr of 0.75 mg/dL). Liver Function Tests: Recent Labs  Lab 06/01/17 1054  AST 25  ALT 14  ALKPHOS 71  BILITOT 0.4  PROT 7.1  ALBUMIN 3.7   No results for input(s): LIPASE, AMYLASE in the last 168 hours. No results for input(s): AMMONIA in the last 168 hours. Coagulation Profile: Recent Labs  Lab 06/01/17 1054  INR 0.91   Cardiac Enzymes: No results for input(s): CKTOTAL, CKMB, CKMBINDEX, TROPONINI in the last 168 hours. BNP (last 3 results) No results for input(s):  PROBNP in the last 8760 hours. HbA1C: Recent Labs    06/02/17 0403  HGBA1C 5.6   CBG: Recent Labs  Lab 06/01/17 1036  GLUCAP 99   Lipid Profile: Recent Labs    06/02/17 0404  CHOL 280*  HDL 49  LDLCALC 197*  TRIG 172*  CHOLHDL 5.7   Thyroid Function Tests: No results for input(s): TSH, T4TOTAL, FREET4, T3FREE, THYROIDAB in the last 72 hours. Anemia Panel: No results for input(s): VITAMINB12, FOLATE, FERRITIN, TIBC, IRON, RETICCTPCT in the last 72 hours. Urine analysis:    Component Value Date/Time   COLORURINE YELLOW 06/01/2017 1016   APPEARANCEUR TURBID (A) 06/01/2017 1016   LABSPEC 1.015 06/01/2017 1016   PHURINE 7.0 06/01/2017 1016   GLUCOSEU NEGATIVE 06/01/2017 1016   HGBUR SMALL (A) 06/01/2017 1016   BILIRUBINUR NEGATIVE 06/01/2017 1016   KETONESUR NEGATIVE 06/01/2017 1016   PROTEINUR NEGATIVE 06/01/2017 1016   UROBILINOGEN 1.0 12/23/2013 1438   NITRITE NEGATIVE 06/01/2017 1016   LEUKOCYTESUR LARGE (A) 06/01/2017 1016   Sepsis Labs: @LABRCNTIP (procalcitonin:4,lacticidven:4)  ) Recent Results (from the past 240 hour(s))  MRSA PCR Screening     Status: None   Collection Time: 06/02/17  1:41 AM  Result Value Ref Range Status   MRSA by PCR NEGATIVE NEGATIVE Final    Comment:        The GeneXpert MRSA Assay (FDA approved for NASAL specimens only), is one component of a comprehensive MRSA colonization surveillance program. It is not intended to diagnose MRSA infection nor to guide or monitor treatment for MRSA infections. Performed at [facility], [address], [place], [place] [address]          Radiology Studies: Dg Chest 2 View  Result Date: 06/01/2017 CLINICAL DATA:  Stroke. EXAM: CHEST - 2 VIEW COMPARISON:  09/06/2013 FINDINGS: The cardiomediastinal contours are unchanged, borderline cardiomegaly. Aortic atherosclerosis. The lungs are clear. Pulmonary vasculature is normal. No consolidation, pleural effusion, or pneumothorax. No  acute osseous abnormalities are seen. The bones are under mineralized. IMPRESSION: Borderline cardiomegaly without acute chest finding. Aortic Atherosclerosis (ICD10-I70.0). Electronically Signed   By: Rubye Oaks M.D.   On: 06/01/2017 23:13   Mr [name] Neck W Contrast  Result Date: 06/01/2017 CLINICAL DATA:  Follow-up stroke. EXAM: MRA NECK WITHOUT AND WITH CONTRAST MRA HEAD WITHOUT CONTRAST TECHNIQUE: Multiplanar and multiecho pulse sequences of the neck were obtained without and with intravenous contrast. Angiographic images of the neck were obtained using MRA technique without and with intravenous contrast; Angiographic images of the Circle of Willis were obtained using MRA technique without intravenous contrast. CONTRAST:  10mL MULTIHANCE GADOBENATE DIMEGLUMINE 529 MG/ML IV SOLN COMPARISON:  MRI of the head June 01, 2017 FINDINGS: MRA NECK FINDINGS ANTERIOR CIRCULATION: The common carotid arteries are widely patent bilaterally. 60% stenosis RIGHT internal carotid artery origin by NASCET criteria. Moderate luminal irregularity RIGHT internal carotid artery seen with atherosclerosis  or fibromuscular dysplasia. 50% stenosis LEFT internal carotid artery origin by NASCET criteria with mild luminal irregularity mid cervical internal carotid artery seen with fibromuscular dysplasia or atherosclerosis. No flow limiting stenosis. POSTERIOR CIRCULATION: Bilateral vertebral arteries are patent to the vertebrobasilar junction. Mild stenosis LEFT vertebral artery origin. No flow limiting stenosis or luminal irregularity. Source images and MIP images were reviewed. MRA HEAD FINDINGS-Mild motion degraded examination. ANTERIOR CIRCULATION: Normal flow related enhancement of the included cervical, petrous, cavernous and supraclinoid internal carotid arteries. Patent anterior communicating artery. Patent anterior and middle cerebral arteries, severe stenosis distal RIGHT M1 segment severe stenosis proximal RIGHT M2  superior and inferior divisions. Severe stenosis LEFT A2 3 junction. Moderate stenosis LEFT M2 superior division and LEFT M3 segment. No large vessel occlusion nor aneurysm. POSTERIOR CIRCULATION: RIGHT vertebral artery is dominant. Tandem moderate stenosis LEFT V4 segment. Basilar artery is patent, with normal flow related enhancement of the main branch vessels. Moderate tandem stenosis superior cerebellar arteries compatible with atherosclerosis. Patent posterior cerebral arteries. Tandem severe stenosis bilateral posterior cerebral arteries occluded RIGHT P1 segment with minimal distal reconstitution. Potentially occluded LEFT P2 and or P3 segments No aneurysm. ANATOMIC VARIANTS: None. Source images and MIP images were reviewed. IMPRESSION: MRA head: 1. RIGHT P1 occlusion with minimal reconstitution. Potentially occluded LEFT P2 and/or P 3 segments. 2. Multifocal severe stenosis cerebral arteries, preponderance within posterior cerebral arteries. MRA neck: 1. 60% stenosis RIGHT internal carotid artery. 2. 50% stenosis LEFT internal carotid artery. Electronically Signed   By: Awilda Metroourtnay  Bloomer M.D.   On: 06/01/2017 20:05   Mr Brain Wo Contrast  Result Date: 06/01/2017 CLINICAL DATA:  Numbness to the LEFT side of the face upon awakening. Facial droop, and LEFT upper extremity weakness. EXAM: MRI HEAD WITHOUT CONTRAST TECHNIQUE: Multiplanar, multiecho pulse sequences of the brain and surrounding structures were obtained without intravenous contrast. COMPARISON:  Code stroke CT 06/01/2017. MR head 02/12/2011. CT head 08/01/2013. FINDINGS: The patient was unable to remain motionless for the exam. Small or subtle lesions could be overlooked. Brain: In the RIGHT mid pons, there is a paramedian focus of restricted diffusion, approximate 3 x 7 mm cross-section, corresponding low ADC, consistent with acute infarction. No other areas of acute infarction are identified. No hemorrhage, mass lesion, hydrocephalus, or  extra-axial fluid. Advanced cerebral and cerebellar atrophy. Extensive T2 and FLAIR hyperintensities throughout the white matter, consistent with small vessel disease. Small vessel microangiopathy changes are also observed in the brainstem, although there is no discrete lacunar infarct to correlate with the RIGHT lower pontine CT hypodensity. Vascular: Normal flow voids. Skull and upper cervical spine: Normal marrow signal. Sinuses/Orbits: Negative. Other: None. Compared with 2012, there is progression of atrophy and small vessel disease. The acute brainstem infarct on today's MR is not visible on CT. IMPRESSION: Acute RIGHT paramedian pontine infarct, nonhemorrhagic. Atrophy and small vessel disease progressive from 2012. Electronically Signed   By: Elsie StainJohn T Curnes M.D.   On: 06/01/2017 12:30   Mr Maxine GlennMra Head Wo Contrast  Result Date: 06/01/2017 CLINICAL DATA:  Follow-up stroke. EXAM: MRA NECK WITHOUT AND WITH CONTRAST MRA HEAD WITHOUT CONTRAST TECHNIQUE: Multiplanar and multiecho pulse sequences of the neck were obtained without and with intravenous contrast. Angiographic images of the neck were obtained using MRA technique without and with intravenous contrast; Angiographic images of the Circle of Willis were obtained using MRA technique without intravenous contrast. CONTRAST:  10mL MULTIHANCE GADOBENATE DIMEGLUMINE 529 MG/ML IV SOLN COMPARISON:  MRI of the head June 01, 2017 FINDINGS:  MRA NECK FINDINGS ANTERIOR CIRCULATION: The common carotid arteries are widely patent bilaterally. 60% stenosis RIGHT internal carotid artery origin by NASCET criteria. Moderate luminal irregularity RIGHT internal carotid artery seen with atherosclerosis or fibromuscular dysplasia. 50% stenosis LEFT internal carotid artery origin by NASCET criteria with mild luminal irregularity mid cervical internal carotid artery seen with fibromuscular dysplasia or atherosclerosis. No flow limiting stenosis. POSTERIOR CIRCULATION: Bilateral  vertebral arteries are patent to the vertebrobasilar junction. Mild stenosis LEFT vertebral artery origin. No flow limiting stenosis or luminal irregularity. Source images and MIP images were reviewed. MRA HEAD FINDINGS-Mild motion degraded examination. ANTERIOR CIRCULATION: Normal flow related enhancement of the included cervical, petrous, cavernous and supraclinoid internal carotid arteries. Patent anterior communicating artery. Patent anterior and middle cerebral arteries, severe stenosis distal RIGHT M1 segment severe stenosis proximal RIGHT M2 superior and inferior divisions. Severe stenosis LEFT A2 3 junction. Moderate stenosis LEFT M2 superior division and LEFT M3 segment. No large vessel occlusion nor aneurysm. POSTERIOR CIRCULATION: RIGHT vertebral artery is dominant. Tandem moderate stenosis LEFT V4 segment. Basilar artery is patent, with normal flow related enhancement of the main branch vessels. Moderate tandem stenosis superior cerebellar arteries compatible with atherosclerosis. Patent posterior cerebral arteries. Tandem severe stenosis bilateral posterior cerebral arteries occluded RIGHT P1 segment with minimal distal reconstitution. Potentially occluded LEFT P2 and or P3 segments No aneurysm. ANATOMIC VARIANTS: None. Source images and MIP images were reviewed. IMPRESSION: MRA head: 1. RIGHT P1 occlusion with minimal reconstitution. Potentially occluded LEFT P2 and/or P 3 segments. 2. Multifocal severe stenosis cerebral arteries, preponderance within posterior cerebral arteries. MRA neck: 1. 60% stenosis RIGHT internal carotid artery. 2. 50% stenosis LEFT internal carotid artery. Electronically Signed   By: Awilda Metro M.D.   On: 06/01/2017 20:05   Ct Head Code Stroke Wo Contrast  Addendum Date: 06/01/2017   ADDENDUM REPORT: 06/01/2017 11:18 ADDENDUM: Study discussed by telephone with Dr. Jomarie Longs ZAMMIT on 06/01/2017 at 1036 hours. Electronically Signed   By: Odessa Fleming M.D.   On: 06/01/2017  11:18   Result Date: 06/01/2017 CLINICAL DATA:  Code stroke. 84 year old female with facial droop, left upper extremity weakness, left facial numbness. EXAM: CT HEAD WITHOUT CONTRAST TECHNIQUE: Contiguous axial images were obtained from the base of the skull through the vertex without intravenous contrast. COMPARISON:  Head CT without contrast 08/01/2013 and brain MRI 02/12/2011. FINDINGS: Brain: Stable cerebral volume since 2015. Chronic Patchy and confluent bilateral cerebral white matter hypodensity. Hypodensity in the right paracentral pons appears stable since 2015, but new since the prior MRI. No acute intracranial hemorrhage identified. No cortically based acute infarct identified. No midline shift, mass effect, or evidence of intracranial mass lesion. Stable ventricle size and configuration. Chronic dystrophic calcification in the left cerebellum is stable. Vascular: Calcified atherosclerosis at the skull base. No suspicious intracranial vascular hyperdensity. Skull: Stable.  No acute osseous abnormality identified. Sinuses/Orbits: Visualized paranasal sinuses and mastoids are stable and well pneumatized. Other: No acute orbit or scalp soft tissue finding. ASPECTS [facility] Stroke Program Early CT Score) - Ganglionic level infarction (caudate, lentiform nuclei, internal capsule, insula, M1-M3 cortex): 7 - Supraganglionic infarction (M4-M6 cortex): 3 Total score (0-10 with 10 being normal): 10 IMPRESSION: 1. No acute intracranial hemorrhage or acute cortically based infarct identified. ASPECTS is 10. 2. Chronically advanced small vessel disease suspected, but appears stable by CT since 2015. Electronically Signed: By: Odessa Fleming M.D. On: 06/01/2017 10:35        Scheduled Meds: . aspirin  81 mg Oral  Daily  . atorvastatin  80 mg Oral q1800  . enoxaparin (LOVENOX) injection  40 mg Subcutaneous Q24H   Continuous Infusions:   LOS: 1 day    Time spent: 25 min    Janine Ores, PA-S [facility] If 7PM-7AM, please contact night-coverage [email or web address] Password [facility] 06/02/2017, 4:02 PM

## 2017-06-03 MED ORDER — CLOPIDOGREL BISULFATE 75 MG PO TABS
75.0000 mg | ORAL_TABLET | Freq: Every day | ORAL | 6 refills | Status: AC
Start: 2017-06-04 — End: ?

## 2017-06-03 MED ORDER — ATORVASTATIN CALCIUM 80 MG PO TABS: 80.0000 mg | ORAL_TABLET | Freq: Every day | ORAL | 6 refills | Status: AC

## 2017-06-03 MED ORDER — ASPIRIN 81 MG PO CHEW: 81.0000 mg | CHEWABLE_TABLET | Freq: Every day | ORAL | 0 refills | Status: DC

## 2017-06-03 NOTE — [place] FL2 (Signed)
  [place] MEDICAID FL2 LEVEL OF CARE SCREENING TOOL     IDENTIFICATION  Patient Name: Alexandra Mckee Birthdate: [birth year] Sex: female Admission Date (Current Location): 06/01/2017  [facility] [place]   Facility and Address:  [facility] [place], [address], [place], [place] [address]      Provider Number: [id number]  Attending Physician Name and Address:  Alberteen Samanford, Christopher P, *  Relative Name and Phone Number:       Current Level of Care: Hospital Recommended Level of Care: Assisted Living Facility Prior Approval Number:    Date Approved/Denied:   PASRR Number:    Discharge Plan: Other (Comment)(ALF)    Current Diagnoses: Patient Active Problem List   Diagnosis Date Noted  . Stroke (HCC) 06/01/2017  . Memory loss 08/28/2013    Orientation RESPIRATION BLADDER Height & Weight     Self  Normal Continent Weight: 128 lb (58.1 kg) Height:  5\' 5"  (165.1 cm)  BEHAVIORAL SYMPTOMS/MOOD NEUROLOGICAL BOWEL NUTRITION STATUS      Continent Diet(Heart healthy, thin liquids)  AMBULATORY STATUS COMMUNICATION OF NEEDS Skin     Verbally Normal                       Personal Care Assistance Level of Assistance  Bathing, Feeding, Dressing           Functional Limitations Info  Sight, Hearing, Speech Sight Info: Adequate Hearing Info: Adequate Speech Info: Adequate    SPECIAL CARE FACTORS FREQUENCY  PT (By licensed PT), OT (By licensed OT)                    Contractures Contractures Info: Not present    Additional Factors Info  Code Status, Allergies Code Status Info: DNR Allergies Info: NO known allergies           Current Medications (06/03/2017):  This is the current hospital active medication list Current Facility-Administered Medications  Medication Dose Route Frequency Provider Last Rate Last Dose  . acetaminophen (TYLENOL) tablet 650 mg  650 mg Oral Q4H PRN Zigmund DanielPowell, A Caldwell Jr., MD       Or  .  acetaminophen (TYLENOL) solution 650 mg  650 mg Per Tube Q4H PRN Zigmund DanielPowell, A Caldwell Jr., MD       Or  . acetaminophen (TYLENOL) suppository 650 mg  650 mg Rectal Q4H PRN Zigmund DanielPowell, A Caldwell Jr., MD      . aspirin chewable tablet 81 mg  81 mg Oral Daily Zigmund DanielPowell, A Caldwell Jr., MD   81 mg at 06/03/17 0900  . atorvastatin (LIPITOR) tablet 80 mg  80 mg Oral q1800 Zigmund DanielPowell, A Caldwell Jr., MD   80 mg at 06/02/17 1725  . clopidogrel (PLAVIX) tablet 75 mg  75 mg Oral Daily Annie MainBiby, Sharon L, NP   75 mg at 06/03/17 0900  . enoxaparin (LOVENOX) injection 40 mg  40 mg Subcutaneous Q24H Zigmund DanielPowell, A Caldwell Jr., MD   40 mg at 06/02/17 2250  . senna-docusate (Senokot-S) tablet 1 tablet  1 tablet Oral QHS PRN Zigmund DanielPowell, A Caldwell Jr., MD         Discharge Medications: Please see discharge summary for a list of discharge medications.  Relevant Imaging Results:  Relevant Lab Results:   Additional Information SSN: [id number]  Maree KrabbeBridget A Damion Kant, LCSW

## 2017-06-03 NOTE — Discharge Summary (Signed)
Physician Discharge Summary  Alexandra Mckee:[id number] DOB: 02/13/1922 DOA: 06/01/2017  PCP: Tally Joe, MD  Admit date: 06/01/2017 Discharge date: 06/03/2017  Admitted From: ALF  Disposition:  ALF   Recommendations for Outpatient Follow-up:  1. Follow up with PCP in 1-2 weeks 2. Please obtain BMP/CBC in one week   Home Health: Yes  Equipment/Devices: Rolling walker, 3 in 1  Discharge Condition: Good  CODE STATUS: DO NOT RESUSCITATE Diet recommendation: Cardiac, low sodium  Brief/Interim Summary: Alexandra Mckee a 84 y.o.femalewith medical history significant ofhypertension, hyperlipidemia, hypokalemia presenting with left-sided facial droop and perioral numbness with imaging findings concerning for stroke.        Discharge Diagnoses:    RIGHT pontine subcentimeter ischemic stroke: MRI shows "In the RIGHT mid pons, there is a paramedian focus of restricted diffusion, approximate 3 x 7 mm cross-section, corresponding low ADC, consistent with acute infarction". Lipids, hemoglobin A1c show LDL >190, A1c normal. Carotid imaging with MRA shows 50-60% stenosis bilaterally only, but overall diffuse intra- and extra-cranial atherosclerosis. -Aspirin 325 changed to Plavix + aspirin 81 for three weeks then Plavix alone -Lipitor 80 added -Echo unremarkable  -PT/OT/SLP were consulted -VTE prophylaxis ordered at time of admission -Atrial fibrillation: not present -tPA not given because of minimal symptom severity -Dysphagia screen ordered in ER -Non-smoker   Hypertension -Resume HCTZ   -Goal BP <130/80 mmHg    Discharge Instructions  Discharge Instructions    Ambulatory referral to Neurology   Complete by:  As directed    Follow up with stroke clinic NP (Jessica Vanschaick or Darrol Angel, if both not available, consider Manson Allan, or Ahern) at [facility] in about 4 weeks. Thanks.   Diet - low sodium heart healthy   Complete by:  As directed    Discharge  instructions   Complete by:  As directed    From Dr. [name]: You were admitted with new left sided weakness and left facial droop from a stroke.  As part of your work up, we found that you have a normal functioning heart, and atherosclerosis (or "fatty plaque" buildup) in your neck and head, that caused the stroke.  The best way to prevent further strokes is to change your aspirin to Plavix.   Take aspirin 81 mg plus Plavix/clopidogrel 75 mg together for 3 weeks, then continue just Plavix daily   Follow up with [facility] in 4-6 weeks.   Increase activity slowly   Complete by:  As directed      Allergies as of 06/03/2017   No Known Allergies     Medication List    TAKE these medications   aspirin 81 MG chewable tablet Chew 1 tablet (81 mg total) by mouth daily. Start taking on:  06/04/2017   atorvastatin 80 MG tablet Commonly known as:  LIPITOR Take 1 tablet (80 mg total) by mouth daily at 6 PM.   clopidogrel 75 MG tablet Commonly known as:  PLAVIX Take 1 tablet (75 mg total) by mouth daily. Start taking on:  06/04/2017   hydrochlorothiazide 25 MG tablet Commonly known as:  HYDRODIURIL Take 25 mg by mouth daily.   mirtazapine 15 MG tablet Commonly known as:  REMERON Take 15 mg by mouth at bedtime.   potassium chloride 10 MEQ tablet Commonly known as:  K-DUR Take 1 tablet (10 mEq total) by mouth 2 (two) times daily.            Durable Medical Equipment  (From admission, onward)  Start     Ordered   06/03/17 1011  For home use only DME Walker rolling  [facility](Walkers)  Once    Question:  Patient needs a walker to treat with the following condition  Answer:  Stroke (cerebrum) (HCC)   06/03/17 1012   06/03/17 1011  DME 3-in-1  Once     06/03/17 1012     Follow-up Information    [facility]. Schedule an appointment as soon as possible for a visit in 4 week(s).   Specialty:  Radiology Contact information: [address] [place] [phone]         No Known Allergies  Consultations:  Neurology   Procedures/Studies: Dg Chest 2 View  Result Date: 06/01/2017 CLINICAL DATA:  Stroke. EXAM: CHEST - 2 VIEW COMPARISON:  09/06/2013 FINDINGS: The cardiomediastinal contours are unchanged, borderline cardiomegaly. Aortic atherosclerosis. The lungs are clear. Pulmonary vasculature is normal. No consolidation, pleural effusion, or pneumothorax. No acute osseous abnormalities are seen. The bones are under mineralized. IMPRESSION: Borderline cardiomegaly without acute chest finding. Aortic Atherosclerosis (ICD10-I70.0). Electronically Signed   By: Rubye OaksMelanie  Ehinger M.D.   On: 06/01/2017 23:13   Mr Alexandra GlennMra Neck W Contrast  Result Date: 06/01/2017 CLINICAL DATA:  Follow-up stroke. EXAM: MRA NECK WITHOUT AND WITH CONTRAST MRA HEAD WITHOUT CONTRAST TECHNIQUE: Multiplanar and multiecho pulse sequences of the neck were obtained without and with intravenous contrast. Angiographic images of the neck were obtained using MRA technique without and with intravenous contrast; Angiographic images of the Circle of Willis were obtained using MRA technique without intravenous contrast. CONTRAST:  10mL MULTIHANCE GADOBENATE DIMEGLUMINE 529 MG/ML IV SOLN COMPARISON:  MRI of the head June 01, 2017 FINDINGS: MRA NECK FINDINGS ANTERIOR CIRCULATION: The common carotid arteries are widely patent bilaterally. 60% stenosis RIGHT internal carotid artery origin by NASCET criteria. Moderate luminal irregularity RIGHT internal carotid artery seen with atherosclerosis or fibromuscular dysplasia. 50% stenosis LEFT internal carotid artery origin by NASCET criteria with mild luminal irregularity mid cervical internal carotid artery seen with fibromuscular dysplasia or atherosclerosis. No flow limiting stenosis. POSTERIOR CIRCULATION: Bilateral vertebral arteries are patent to the vertebrobasilar junction. Mild stenosis  LEFT vertebral artery origin. No flow limiting stenosis or luminal irregularity. Source images and MIP images were reviewed. MRA HEAD FINDINGS-Mild motion degraded examination. ANTERIOR CIRCULATION: Normal flow related enhancement of the included cervical, petrous, cavernous and supraclinoid internal carotid arteries. Patent anterior communicating artery. Patent anterior and middle cerebral arteries, severe stenosis distal RIGHT M1 segment severe stenosis proximal RIGHT M2 superior and inferior divisions. Severe stenosis LEFT A2 3 junction. Moderate stenosis LEFT M2 superior division and LEFT M3 segment. No large vessel occlusion nor aneurysm. POSTERIOR CIRCULATION: RIGHT vertebral artery is dominant. Tandem moderate stenosis LEFT V4 segment. Basilar artery is patent, with normal flow related enhancement of the main branch vessels. Moderate tandem stenosis superior cerebellar arteries compatible with atherosclerosis. Patent posterior cerebral arteries. Tandem severe stenosis bilateral posterior cerebral arteries occluded RIGHT P1 segment with minimal distal reconstitution. Potentially occluded LEFT P2 and or P3 segments No aneurysm. ANATOMIC VARIANTS: None. Source images and MIP images were reviewed. IMPRESSION: MRA head: 1. RIGHT P1 occlusion with minimal reconstitution. Potentially occluded LEFT P2 and/or P 3 segments. 2. Multifocal severe stenosis cerebral arteries, preponderance within posterior cerebral arteries. MRA neck: 1. 60% stenosis RIGHT internal carotid artery. 2. 50% stenosis LEFT internal carotid artery. Electronically Signed   By: Awilda Metroourtnay  Bloomer M.D.   On: 06/01/2017 20:05  Mr Brain Wo Contrast  Result Date: 06/01/2017 CLINICAL DATA:  Numbness to the LEFT side of the face upon awakening. Facial droop, and LEFT upper extremity weakness. EXAM: MRI HEAD WITHOUT CONTRAST TECHNIQUE: Multiplanar, multiecho pulse sequences of the brain and surrounding structures were obtained without intravenous  contrast. COMPARISON:  Code stroke CT 06/01/2017. MR head 02/12/2011. CT head 08/01/2013. FINDINGS: The patient was unable to remain motionless for the exam. Small or subtle lesions could be overlooked. Brain: In the RIGHT mid pons, there is a paramedian focus of restricted diffusion, approximate 3 x 7 mm cross-section, corresponding low ADC, consistent with acute infarction. No other areas of acute infarction are identified. No hemorrhage, mass lesion, hydrocephalus, or extra-axial fluid. Advanced cerebral and cerebellar atrophy. Extensive T2 and FLAIR hyperintensities throughout the white matter, consistent with small vessel disease. Small vessel microangiopathy changes are also observed in the brainstem, although there is no discrete lacunar infarct to correlate with the RIGHT lower pontine CT hypodensity. Vascular: Normal flow voids. Skull and upper cervical spine: Normal marrow signal. Sinuses/Orbits: Negative. Other: None. Compared with 2012, there is progression of atrophy and small vessel disease. The acute brainstem infarct on today's MR is not visible on CT. IMPRESSION: Acute RIGHT paramedian pontine infarct, nonhemorrhagic. Atrophy and small vessel disease progressive from 2012. Electronically Signed   By: Elsie Stain M.D.   On: 06/01/2017 12:30   Mr Alexandra Mckee Head Wo Contrast  Result Date: 06/01/2017 CLINICAL DATA:  Follow-up stroke. EXAM: MRA NECK WITHOUT AND WITH CONTRAST MRA HEAD WITHOUT CONTRAST TECHNIQUE: Multiplanar and multiecho pulse sequences of the neck were obtained without and with intravenous contrast. Angiographic images of the neck were obtained using MRA technique without and with intravenous contrast; Angiographic images of the Circle of Willis were obtained using MRA technique without intravenous contrast. CONTRAST:  10mL MULTIHANCE GADOBENATE DIMEGLUMINE 529 MG/ML IV SOLN COMPARISON:  MRI of the head June 01, 2017 FINDINGS: MRA NECK FINDINGS ANTERIOR CIRCULATION: The common carotid  arteries are widely patent bilaterally. 60% stenosis RIGHT internal carotid artery origin by NASCET criteria. Moderate luminal irregularity RIGHT internal carotid artery seen with atherosclerosis or fibromuscular dysplasia. 50% stenosis LEFT internal carotid artery origin by NASCET criteria with mild luminal irregularity mid cervical internal carotid artery seen with fibromuscular dysplasia or atherosclerosis. No flow limiting stenosis. POSTERIOR CIRCULATION: Bilateral vertebral arteries are patent to the vertebrobasilar junction. Mild stenosis LEFT vertebral artery origin. No flow limiting stenosis or luminal irregularity. Source images and MIP images were reviewed. MRA HEAD FINDINGS-Mild motion degraded examination. ANTERIOR CIRCULATION: Normal flow related enhancement of the included cervical, petrous, cavernous and supraclinoid internal carotid arteries. Patent anterior communicating artery. Patent anterior and middle cerebral arteries, severe stenosis distal RIGHT M1 segment severe stenosis proximal RIGHT M2 superior and inferior divisions. Severe stenosis LEFT A2 3 junction. Moderate stenosis LEFT M2 superior division and LEFT M3 segment. No large vessel occlusion nor aneurysm. POSTERIOR CIRCULATION: RIGHT vertebral artery is dominant. Tandem moderate stenosis LEFT V4 segment. Basilar artery is patent, with normal flow related enhancement of the main branch vessels. Moderate tandem stenosis superior cerebellar arteries compatible with atherosclerosis. Patent posterior cerebral arteries. Tandem severe stenosis bilateral posterior cerebral arteries occluded RIGHT P1 segment with minimal distal reconstitution. Potentially occluded LEFT P2 and or P3 segments No aneurysm. ANATOMIC VARIANTS: None. Source images and MIP images were reviewed. IMPRESSION: MRA head: 1. RIGHT P1 occlusion with minimal reconstitution. Potentially occluded LEFT P2 and/or P 3 segments. 2. Multifocal severe stenosis cerebral arteries,  preponderance within posterior  cerebral arteries. MRA neck: 1. 60% stenosis RIGHT internal carotid artery. 2. 50% stenosis LEFT internal carotid artery. Electronically Signed   By: Awilda Metro M.D.   On: 06/01/2017 20:05   Ct Head Code Stroke Wo Contrast  Addendum Date: 06/01/2017   ADDENDUM REPORT: 06/01/2017 11:18 ADDENDUM: Study discussed by telephone with Dr. Jomarie Longs ZAMMIT on 06/01/2017 at 1036 hours. Electronically Signed   By: Odessa Fleming M.D.   On: 06/01/2017 11:18   Result Date: 06/01/2017 CLINICAL DATA:  Code stroke. 84 year old female with facial droop, left upper extremity weakness, left facial numbness. EXAM: CT HEAD WITHOUT CONTRAST TECHNIQUE: Contiguous axial images were obtained from the base of the skull through the vertex without intravenous contrast. COMPARISON:  Head CT without contrast 08/01/2013 and brain MRI 02/12/2011. FINDINGS: Brain: Stable cerebral volume since 2015. Chronic Patchy and confluent bilateral cerebral white matter hypodensity. Hypodensity in the right paracentral pons appears stable since 2015, but new since the prior MRI. No acute intracranial hemorrhage identified. No cortically based acute infarct identified. No midline shift, mass effect, or evidence of intracranial mass lesion. Stable ventricle size and configuration. Chronic dystrophic calcification in the left cerebellum is stable. Vascular: Calcified atherosclerosis at the skull base. No suspicious intracranial vascular hyperdensity. Skull: Stable.  No acute osseous abnormality identified. Sinuses/Orbits: Visualized paranasal sinuses and mastoids are stable and well pneumatized. Other: No acute orbit or scalp soft tissue finding. ASPECTS [facility] Stroke Program Early CT Score) - Ganglionic level infarction (caudate, lentiform nuclei, internal capsule, insula, M1-M3 cortex): 7 - Supraganglionic infarction (M4-M6 cortex): 3 Total score (0-10 with 10 being normal): 10 IMPRESSION: 1. No acute intracranial hemorrhage  or acute cortically based infarct identified. ASPECTS is 10. 2. Chronically advanced small vessel disease suspected, but appears stable by CT since 2015. Electronically Signed: By: Odessa Fleming M.D. On: 06/01/2017 10:35   Echo Study Conclusions  - Left ventricle: The cavity size was normal. There was mild focal   basal hypertrophy of the septum. Systolic function was normal.   The estimated ejection fraction was in the range of 60% to 65%.   Wall motion was normal; there were no regional wall motion   abnormalities. Doppler parameters are consistent with abnormal   left ventricular relaxation (grade 1 diastolic dysfunction).   Doppler parameters are consistent with high ventricular filling   pressure. - Aortic valve: Valve mobility was restricted. There was mild   stenosis. - Mitral valve: Calcified annulus. - Left atrium: The atrium was moderately dilated.  Impressions:  - Normal LV systolic function; mild diastolic dysfunction;   calcified aortic valve with probable fusion of noncoronary and   left cusps; mild AS (mean gradient 13 mmHg); moderate LAE; mild   TR.      Subjective: Feels tired, eager to go home.  Still with left sided weaknses, somewhat better.  No confusion, fever, cough, dysuria.  Discharge Exam: Vitals:   06/03/17 0750 06/03/17 1218  BP: (!) 142/66 (!) 120/51  Pulse: 63 (!) 59  Resp: 18 18  Temp: 97.6 F (36.4 C) 98.1 F (36.7 C)  SpO2: 98% 98%   Vitals:   06/03/17 0022 06/03/17 0421 06/03/17 0750 06/03/17 1218  BP: (!) 123/53 133/65 (!) 142/66 (!) 120/51  Pulse: (!) 56 66 63 (!) 59  Resp: 16 18 18 18   Temp: 97.6 F (36.4 C)  97.6 F (36.4 C) 98.1 F (36.7 C)  TempSrc: Oral  Oral Oral  SpO2: 100% 96% 98% 98%  Weight:  Height:        General: Pt is alert, awake, not in acute distress, just finished breakfast, lying in bed Cardiovascular: RRR, S1/S2 +, systolic murmur noted, no LE edema Respiratory: CTA bilaterally, no wheezing, no  rhonchi Abdominal: Soft, NT, ND, bowel sounds + Extremities: no edema, no cyanosis Neuro: Left sided facial droop noted.  Left sided weakness, mild.      The results of significant diagnostics from this hospitalization (including imaging, microbiology, ancillary and laboratory) are listed below for reference.     Microbiology: Recent Results (from the past 240 hour(s))  Urine Culture     Status: Abnormal (Preliminary result)   Collection Time: 06/01/17  6:50 PM  Result Value Ref Range Status   Specimen Description   Final    URINE, RANDOM Performed at [facility], [address] [place], [place] [address]    Special Requests   Final    NONE Performed at [facility], [address], [place], [place] [address]    Culture (A)  Final    >=100,000 COLONIES/mL KLEBSIELLA PNEUMONIAE SUSCEPTIBILITIES TO FOLLOW Performed at [facility], [address] [place], [place] [address]    Report Status PENDING  Incomplete  MRSA PCR Screening     Status: None   Collection Time: 06/02/17  1:41 AM  Result Value Ref Range Status   MRSA by PCR NEGATIVE NEGATIVE Final    Comment:        The GeneXpert MRSA Assay (FDA approved for NASAL specimens only), is one component of a comprehensive MRSA colonization surveillance program. It is not intended to diagnose MRSA infection nor to guide or monitor treatment for MRSA infections. Performed at [facility], [address], [place], [place] [address]      Labs: BNP (last 3 results) No results for input(s): BNP in the last 8760 hours. Basic Metabolic Panel: Recent Labs  Lab 06/01/17 1047 06/01/17 1054 06/01/17 2147  NA 143 143  --   K 3.6 3.7  --   CL 104 105  --   CO2  --  27  --   GLUCOSE 105* 106*  --   BUN 26* 27*  --   CREATININE 0.70 0.86 0.75  CALCIUM  --  9.3  --    Liver Function Tests: Recent Labs  Lab 06/01/17 1054  AST 25  ALT 14  ALKPHOS 71  BILITOT 0.4   PROT 7.1  ALBUMIN 3.7   No results for input(s): LIPASE, AMYLASE in the last 168 hours. No results for input(s): AMMONIA in the last 168 hours. CBC: Recent Labs  Lab 06/01/17 1047 06/01/17 1054 06/02/17 0403  WBC  --  6.5 6.8  NEUTROABS  --  3.4  --   HGB 12.2 12.4 11.8*  HCT 36.0 38.4 36.4  MCV  --  94.8 93.6  PLT  --  192 179   Cardiac Enzymes: No results for input(s): CKTOTAL, CKMB, CKMBINDEX, TROPONINI in the last 168 hours. BNP: Invalid input(s): POCBNP CBG: Recent Labs  Lab 06/01/17 1036  GLUCAP 99   D-Dimer No results for input(s): DDIMER in the last 72 hours. Hgb A1c Recent Labs    06/02/17 0403  HGBA1C 5.6   Lipid Profile Recent Labs    06/02/17 0404  CHOL 280*  HDL 49  LDLCALC 197*  TRIG 172*  CHOLHDL 5.7   Thyroid function studies No results for input(s): TSH, T4TOTAL, T3FREE, THYROIDAB in the last 72  hours.  Invalid input(s): FREET3 Anemia work up No results for input(s): VITAMINB12, FOLATE, FERRITIN, TIBC, IRON, RETICCTPCT in the last 72 hours. Urinalysis    Component Value Date/Time   COLORURINE YELLOW 06/01/2017 1016   APPEARANCEUR TURBID (A) 06/01/2017 1016   LABSPEC 1.015 06/01/2017 1016   PHURINE 7.0 06/01/2017 1016   GLUCOSEU NEGATIVE 06/01/2017 1016   HGBUR SMALL (A) 06/01/2017 1016   BILIRUBINUR NEGATIVE 06/01/2017 1016   KETONESUR NEGATIVE 06/01/2017 1016   PROTEINUR NEGATIVE 06/01/2017 1016   UROBILINOGEN 1.0 12/23/2013 1438   NITRITE NEGATIVE 06/01/2017 1016   LEUKOCYTESUR LARGE (A) 06/01/2017 1016   Sepsis Labs Invalid input(s): PROCALCITONIN,  WBC,  LACTICIDVEN Microbiology Recent Results (from the past 240 hour(s))  Urine Culture     Status: Abnormal (Preliminary result)   Collection Time: 06/01/17  6:50 PM  Result Value Ref Range Status   Specimen Description   Final    URINE, RANDOM Performed at [facility], [address], [place], [place] [address]    Special Requests   Final     NONE Performed at [facility], [address] [place], [place] [address]    Culture (A)  Final    >=100,000 COLONIES/mL KLEBSIELLA PNEUMONIAE SUSCEPTIBILITIES TO FOLLOW Performed at [facility], [address], [place], [place] [address]    Report Status PENDING  Incomplete  MRSA PCR Screening     Status: None   Collection Time: 06/02/17  1:41 AM  Result Value Ref Range Status   MRSA by PCR NEGATIVE NEGATIVE Final    Comment:        The GeneXpert MRSA Assay (FDA approved for NASAL specimens only), is one component of a comprehensive MRSA colonization surveillance program. It is not intended to diagnose MRSA infection nor to guide or monitor treatment for MRSA infections. Performed at [facility], [address], [place], [place] [address]      Time coordinating discharge: 35 minutes  SIGNED:   Alberteen Sam, MD  [facility] 06/03/2017, 11:00 AM

## 2017-06-03 NOTE — Progress Notes (Addendum)
Patient with daughter and son-in-law at bedside, ready to leave to go [facility].  Attempted to call [facility] twice to give report, telephone rang several times before cutting off. Will attempt to keep calling.   Was able to give report to [employer] [facility] at [facility].

## 2017-06-03 NOTE — Care Management Note (Signed)
Case Management Note  Patient Details  Name: Alexandra CoeLois W Nutting MRN: [id number] Date of Birth: 02-Nov-1921  Subjective/Objective:      Pt presented for CVA.  Pt from [facility]F and to return there.           Action/Plan: [facility] and they do not provide DME.  RW and 3n1 ordered to be delivered to patient's room prior to d/c.   Expected Discharge Date:  06/03/17               Expected Discharge Plan:  Home w Home Health Services(ALF- [facility])  In-House Referral:  Clinical Social Work  Discharge planning Services  CM Consult  Post Acute Care Choice:  Home Health, Durable Medical Equipment Choice offered to:  NA(Patient and family unavailable; [facility])  DME Arranged:  3-N-1, Walker rolling DME Agency:  [facility]  HH Arranged:  PT HH Agency:  [facility]  Status of Service:  Completed, signed off  If discussed at [employer] Meetings, dates discussed:    Additional Comments:  Deveron Furlongshley  John Williamsen, RN 06/03/2017, 2:57 PM

## 2017-06-03 NOTE — Clinical Social Work Note (Signed)
Facility is prepared for pt. Family to transport. [facility]. Clinical Social Worker will sign off for now as social work intervention is no longer needed. Please consult us again if new need arises.   [place] Hill[place]ridget Ryah [place], [place] [phone]

## 2017-06-04 LAB — URINE CULTURE: Culture: >=100000 — AB

## 2017-07-03 ENCOUNTER — Encounter: Payer: Self-pay | Admitting: Adult Health

## 2017-07-03 ENCOUNTER — Ambulatory Visit (INDEPENDENT_AMBULATORY_CARE_PROVIDER_SITE_OTHER): Payer: Medicare Other | Admitting: Adult Health

## 2017-07-03 VITALS — BP 110/55 | HR 66 | Ht 64.0 in | Wt 123.0 lb

## 2017-07-03 DIAGNOSIS — I6302 Cerebral infarction due to thrombosis of basilar artery: Secondary | ICD-10-CM | POA: Diagnosis not present

## 2017-07-03 DIAGNOSIS — I10 Essential (primary) hypertension: Secondary | ICD-10-CM

## 2017-07-03 DIAGNOSIS — E785 Hyperlipidemia, unspecified: Secondary | ICD-10-CM | POA: Diagnosis not present

## 2017-07-03 NOTE — Patient Instructions (Addendum)
Continue clopidogrel 75 mg daily  and lipitor  for secondary stroke prevention  Stop aspirin and continue plavix only  Continue to follow up with PCP regarding cholesterol and blood pressure management   Continue PT - if additional orders are needed, please contact us  Continue to monitor blood pressure at home  Maintain strict control of hypertension with blood pressure goal below 130/90, diabetes with hemoglobin A1c goal below 6.5% and cholesterol with LDL cholesterol (bad cholesterol) goal below 70 mg/dL. I also advised the patient to eat a healthy diet with plenty of whole grains, cereals, fruits and vegetables, exercise regularly and maintain ideal body weight.  Followup in the future with me as needed due to difficulty with transporting patient - daughter will let us know if there are any concerns or issues       Thank you for coming to see [place] at [facility]. I hope we have been able to provide you high quality care today.  You may receive a patient satisfaction survey over the next few weeks. We would appreciate your feedback and comments so that we may continue to improve ourselves and the health of our patients.

## 2017-07-03 NOTE — Progress Notes (Signed)
[facility] [address] [place]. [place] [address] [phone]       OFFICE FOLLOW UP NOTE  [name] Date of Birth:  1921-06-21 Medical Record Number:  [id number]   Reason for Referral:  hospital stroke follow up  CHIEF COMPLAINT:  Chief Complaint  Patient presents with  . Follow-up    Follow up from hospital saw Dr Roda Shutters and Dr.Sethi in hospital , [facility]    HPI: Alexandra Mckee is being seen today for initial visit in the office for right paramedian pontine infarct secondary to SVD on 06/01/17. History obtained from patient and chart review. Reviewed all radiology images and labs personally.  [name] is a 84 y.o. female with history of HTN, HLD and hypokalemia presenting with Left facial numbness, LUE weakness and facial droop. MRI showed right pontine small infarct.  MRA head and neck diffuse vascular atherosclerosis including right ICA 60%, left ICA 50% stenosis, right P1 occlusion, and left P2/P3 occlusion.  EF 60-65%.  LDL 197 and A1c 5.6.  UDS negative. Exam showed mild left facial droop, left upper extremity pronator drift with left upper and lower extremity 4/5 strength.  Otherwise unremarkable. Patient stroke consider small vessel disease.  Recommend dual antiplatelet with aspirin 81 and Plavix for 3 weeks and then Plavix alone.  Continue Lipitor 80 on discharge.  PT/OT recommend home health PT/OT.  Patient discharged home in stable condition.  Since discharge, patient has been doing well and is accompanied at today's appointment with her daughter.  She is currently living at [facility] which is an assisted living facility.  She is recurrently receiving PT and has been making improvement with left-sided weakness.  Continue taking aspirin and Plavix without side effects of bleeding or bruising (patient was supposed to stop aspirin and continue Plavix only after 3 weeks).  Continues to take Lipitor without side effects of myalgias.  Blood pressure satisfactory at 110/55. Patient states that her memory is good and her daughter agrees.  AFT 6.  Recall 0/3.  Clock drawing 3/4.  Disoriented to time stating it was 47 and then daughter harshly told her she knows what year it is she said it was 2012 along with leaving the month was April.  When asked if she knew where she was located, she states that she did not now and once again daughter spoke loudly to her stating that she knew where she was at she just does not know the name of the office and then she stated neurology.  She continues to stay active at her assisted living facility.  She is currently ambulating without use of rolling walker.  Denies new or worsening stroke/TIA symptoms.  ROS:   14 system review of systems performed and negative with exception of anxiety  PMH:  Past Medical History:  Diagnosis Date  . Hyperlipidemia   . Hypertension   . Hypokalemia   . Stroke [facility])     PSH:  Past Surgical History:  Procedure Laterality Date  . ABDOMINAL HYSTERECTOMY      Social History:  Social History   Socioeconomic History  . Marital status: Single    Spouse name: Not on file  . Number of children: Not on file  . Years of education: Not on file  . Highest education level: Not on file  Occupational History  . Not on file  Social Needs  . Financial resource strain: Not on file  . Food insecurity:  Worry: Not on file    Inability: Not on file  . Transportation needs:    Medical: Not on file    Non-medical: Not on file  Tobacco Use  . Smoking status: Never Smoker  . Smokeless tobacco: Never Used  Substance and Sexual Activity  . Alcohol use: No  . Drug use: No  . Sexual activity: Not on file  Lifestyle  . Physical activity:    Days per week: Not on file    Minutes per session: Not on file  . Stress: Not on file  Relationships  . Social connections:    Talks on phone: Not on file    Gets together: Not on file    Attends religious service: Not  on file    Active member of club or organization: Not on file    Attends meetings of clubs or organizations: Not on file    Relationship status: Not on file  . Intimate partner violence:    Fear of current or ex partner: Not on file    Emotionally abused: Not on file    Physically abused: Not on file    Forced sexual activity: Not on file  Other Topics Concern  . Not on file  Social History Narrative  . Not on file    Family History:  Family History  Problem Relation Age of Onset  . Heart attack Father     Medications:   Current Outpatient Medications on File Prior to Visit  Medication Sig Dispense Refill  . atorvastatin (LIPITOR) 80 MG tablet Take 1 tablet (80 mg total) by mouth daily at 6 PM. 30 tablet 6  . clopidogrel (PLAVIX) 75 MG tablet Take 1 tablet (75 mg total) by mouth daily. 30 tablet 6  . hydrochlorothiazide (HYDRODIURIL) 25 MG tablet Take 25 mg by mouth daily.    . mirtazapine (REMERON) 15 MG tablet Take 15 mg by mouth at bedtime.    . potassium chloride (K-DUR) 10 MEQ tablet Take 1 tablet (10 mEq total) by mouth 2 (two) times daily. 10 tablet 0   No current facility-administered medications on file prior to visit.     Allergies:  No Known Allergies   Physical Exam  Vitals:   07/03/17 1531  BP: (!) 110/55  Pulse: 66  Weight: 123 lb (55.8 kg)  Height:  (1.626 m)   Body mass index is 21.11 kg/m. No exam data present  General: well developed, frail elderly Caucasian female, well nourished, seated, in no evident distress Head: head normocephalic and atraumatic.   Neck: supple with no carotid or supraclavicular bruits Cardiovascular: regular rate and rhythm, no murmurs Musculoskeletal: no deformity Skin:  no rash/petichiae Vascular:  Normal pulses all extremities  Neurologic Exam Mental Status: Awake and fully alert.  Disoriented to place and time. Attention span, concentration and fund of knowledge appropriate. Mood and affect appropriate.  AFT  6.  Recall 0/3.  Clock drawing 3/4. Cranial Nerves: Fundoscopic exam reveals sharp disc margins. Pupils equal, briskly reactive to light. Extraocular movements full without nystagmus. Visual fields full to confrontation. Hearing intact. Facial sensation intact. Face, tongue, palate moves normally and symmetrically.  Motor: Normal bulk and tone. Normal strength in all tested extremity muscles. Sensory.: intact to touch , pinprick , position and vibratory sensation.  Coordination: Rapid alternating movements normal in all extremities. Finger-to-nose and heel-to-shin performed accurately bilaterally.  Right arm orbits over left. Gait and Station: Arises from chair without difficulty. Stance is normal. Gait demonstrates normal stride  length and balance . Able to heel, toe and tandem walk without difficulty.  Reflexes: 1+ and symmetric. Toes downgoing.    NIHSS  0 Modified Rankin  1   Diagnostic Data (Labs, Imaging, Testing)  CT HEAD WO CONTRAST 06/01/2017 IMPRESSION: 1. No acute intracranial hemorrhage or acute cortically based infarct identified. ASPECTS is 10. 2. Chronically advanced small vessel disease suspected, but appears stable by CT since 2015.  MR BRAIN WO CONTRAST 06/01/2017 IMPRESSION: Acute RIGHT paramedian pontine infarct, nonhemorrhagic. Atrophy and small vessel disease progressive from 2012.  MR MRA HEAD WO CONTRAST MR MRA NECK W CONTRAST 06/01/2017 IMPRESSION: MRA head: 1. RIGHT P1 occlusion with minimal reconstitution. Potentially occluded LEFT P2 and/or P 3 segments. 2. Multifocal severe stenosis cerebral arteries, preponderance within posterior cerebral arteries. MRA neck: 1. 60% stenosis RIGHT internal carotid artery. 2. 50% stenosis LEFT internal carotid artery.  ECHOCARDIOGRAM COMPLETE 06/02/2017 Study Conclusions - Left ventricle: The cavity size was normal. There was mild focal   basal hypertrophy of the septum. Systolic function was normal.   The  estimated ejection fraction was in the range of 60% to 65%.   Wall motion was normal; there were no regional wall motion   abnormalities. Doppler parameters are consistent with abnormal   left ventricular relaxation (grade 1 diastolic dysfunction).   Doppler parameters are consistent with high ventricular filling   pressure. - Aortic valve: Valve mobility was restricted. There was mild   stenosis. - Mitral valve: Calcified annulus. - Left atrium: The atrium was moderately dilated. Impressions: - Normal LV systolic function; mild diastolic dysfunction;   calcified aortic valve with probable fusion of noncoronary and   left cusps; mild AS (mean gradient 13 mmHg); moderate LAE; mild   TR.     ASSESSMENT: Alexandra Mckee is a 84 y.o. year old female here with right paramedi on an pontine infarct on 06/01/2017 secondary to small vessel disease. Vascular risk factors include HTN and HLD.    PLAN: -Continue clopidogrel 75 mg daily  and Lipitor 80 for secondary stroke prevention -stop aspirin as DAPT recommended for 3 weeks only (currently 4 weeks out) -F/u with PCP regarding your HLD and HTN management -continue to monitor BP at home -continue PT -Maintain strict control of hypertension with blood pressure goal below 130/90, diabetes with hemoglobin A1c goal below 6.5% and cholesterol with LDL cholesterol (bad cholesterol) goal below 70 mg/dL. I also advised the patient to eat a healthy diet with plenty of whole grains, cereals, fruits and vegetables, exercise regularly and maintain ideal body weight.  Daughter requested to not make a follow-up appointment as patient gets anxious about going to doctors and agreed to notify us if patient condition worsens or new symptoms.  Patient does have PCP that she follows up with regularly.  Greater than 50% time during this 25 minute consultation visit was spent on counseling and coordination of care about HLD, and HTN, discussion about risk benefit of  anticoagulation and answering questions.     George Hugh, AGNP-BC  [facility] [address] [place], [place] [address]  Phone [phone] Fax [phone]

## 2017-07-05 NOTE — Progress Notes (Signed)
I reviewed above note and agree with the assessment and plan.  Marvel Plan, MD PhD Stroke Neurology 07/05/2017 12:57 PM

## 2018-04-18 ENCOUNTER — Encounter (HOSPITAL_COMMUNITY): Payer: Self-pay | Admitting: Internal Medicine

## 2018-04-18 ENCOUNTER — Emergency Department (HOSPITAL_COMMUNITY)
Admission: EM | Admit: 2018-04-18 | Discharge: 2018-04-18 | Disposition: A | Discharge status: Home / Self Care | Payer: Medicare Other | Attending: Emergency Medicine | Admitting: Emergency Medicine

## 2018-04-18 DIAGNOSIS — J029 Acute pharyngitis, unspecified: Secondary | ICD-10-CM | POA: Diagnosis not present

## 2018-04-18 DIAGNOSIS — I10 Essential (primary) hypertension: Secondary | ICD-10-CM | POA: Insufficient documentation

## 2018-04-18 DIAGNOSIS — Z043 Encounter for examination and observation following other accident: Secondary | ICD-10-CM | POA: Diagnosis present

## 2018-04-18 DIAGNOSIS — Z79899 Other long term (current) drug therapy: Secondary | ICD-10-CM | POA: Diagnosis not present

## 2018-04-18 DIAGNOSIS — W19XXXA Unspecified fall, initial encounter: Secondary | ICD-10-CM

## 2018-04-18 MED ORDER — DICLOFENAC SODIUM 1 % TD GEL: 4.0000 g | Freq: Once | TRANSDERMAL | Status: DC

## 2018-04-18 MED ORDER — ACETAMINOPHEN 500 MG PO TABS
1000.0000 mg | ORAL_TABLET | Freq: Once | ORAL | Status: DC
Start: 2018-04-18 — End: 2018-04-18

## 2018-04-18 MED ORDER — OXYCODONE HCL 5 MG PO TABS: 2.5000 mg | ORAL_TABLET | Freq: Once | ORAL | Status: DC

## 2018-04-18 NOTE — ED Notes (Addendum)
RN informed Pt can receive 2 visitors 

## 2018-04-18 NOTE — Discharge Instructions (Signed)
Take tylenol 2 pills 4 times a day for pain. Drink plenty of fluids.  Return for worsening shortness of breath, headache, confusion. Follow up with your family doctor.

## 2018-04-18 NOTE — ED Notes (Signed)
Patient verbalizes understanding of discharge instructions. Opportunity for questioning and answers were provided. Armband removed by staff, pt discharged from ED.  

## 2018-04-18 NOTE — ED Notes (Signed)
Report given to [facility], Technical sales engineer, at [facility]

## 2018-04-18 NOTE — ED Triage Notes (Signed)
Pt here for evaluation from [facility] after she fell attempting to get into her bed this afternoon. Per EMS pt is on blood thinners. No injury/trauma noted. Denies LOC, denies pain, denies hitting head. Only complaint is sore throat x1 day. A/Ox4 at this time.

## 2018-04-18 NOTE — ED Provider Notes (Addendum)
[facility] EMERGENCY DEPARTMENT Provider Note   CSN: [id number] Arrival date & time: 04/18/18  1545    History   Chief Complaint Chief Complaint  Patient presents with  . Fall    on thinners    HPI Alexandra Mckee is a 85 y.o. female.     85 yo F with a chief complaint of a fall out of bed.  Per the patient and family she lost her balance and had a controlled slide to the ground.  She denies injury in the fall.  He states that her bed is very low.  She was evaluated there by a provider and there was nothing that they found that was concerning but they told the family that she really ought to come to the hospital for an evaluation since she is on blood thinners and had a prior stroke.  Family was unsure of why they wanted her to come here but they were insistent and so they brought her here.  She denies any complaints.  The history is provided by the patient.  Fall  This is a new problem. The current episode started less than 1 hour ago. The problem occurs constantly. The problem has not changed since onset.Pertinent negatives include no chest pain, no headaches and no shortness of breath. Nothing aggravates the symptoms. Nothing relieves the symptoms. She has tried nothing for the symptoms. The treatment provided no relief.    Past Medical History:  Diagnosis Date  . Hyperlipidemia   . Hypertension   . Hypokalemia   . Stroke [facility])     Patient Active Problem List   Diagnosis Date Noted  . Stroke (HCC) 06/01/2017  . Memory loss 08/28/2013    Past Surgical History:  Procedure Laterality Date  . ABDOMINAL HYSTERECTOMY       OB History   No obstetric history on file.      Home Medications    Prior to Admission medications   Medication Sig Start Date End Date Taking? Authorizing Provider  atorvastatin (LIPITOR) 80 MG tablet Take 1 tablet (80 mg total) by mouth daily at 6 PM. 06/03/17   Danford, Earl Lites, MD  clopidogrel (PLAVIX) 75 MG tablet Take  1 tablet (75 mg total) by mouth daily. 06/04/17   Danford, Earl Lites, MD  hydrochlorothiazide (HYDRODIURIL) 25 MG tablet Take 25 mg by mouth daily.    [provider]  mirtazapine (REMERON) 15 MG tablet Take 15 mg by mouth at bedtime.    [provider]  potassium chloride (K-DUR) 10 MEQ tablet Take 1 tablet (10 mEq total) by mouth 2 (two) times daily. 08/01/13   Vanetta Mulders, MD    Family History Family History  Problem Relation Age of Onset  . Heart attack Father     Social History Social History   Tobacco Use  . Smoking status: Never Smoker  . Smokeless tobacco: Never Used  Substance Use Topics  . Alcohol use: No  . Drug use: No     Allergies   Patient has no known allergies.   Review of Systems Review of Systems  Constitutional: Negative for chills and fever.  HENT: Positive for sore throat. Negative for congestion and rhinorrhea.   Eyes: Negative for redness and visual disturbance.  Respiratory: Negative for shortness of breath and wheezing.   Cardiovascular: Negative for chest pain and palpitations.  Gastrointestinal: Negative for nausea and vomiting.  Genitourinary: Negative for dysuria and urgency.  Musculoskeletal: Negative for arthralgias and myalgias.  Skin: Negative for pallor and wound.  Neurological: Negative for dizziness and headaches.     Physical Exam Updated Vital Signs BP (!) 154/70   Pulse 74   Temp 98.8 F (37.1 C) (Oral)   Resp 16   SpO2 95%   Physical Exam Vitals signs and nursing note reviewed.  Constitutional:      General: She is not in acute distress.    Appearance: She is well-developed. She is not diaphoretic.  HENT:     Head: Normocephalic and atraumatic.     Comments: Swollen turbinates, posterior nasal drip, no noted sinus ttp, tm normal bilaterally.   Eyes:     Pupils: Pupils are equal, round, and reactive to light.  Neck:     Musculoskeletal: Normal range of motion and neck supple.    Cardiovascular:     Rate and Rhythm: Normal rate and regular rhythm.     Heart sounds: No murmur. No friction rub. No gallop.   Pulmonary:     Effort: Pulmonary effort is normal.     Breath sounds: No wheezing or rales.  Abdominal:     General: There is no distension.     Palpations: Abdomen is soft.     Tenderness: There is no abdominal tenderness.  Musculoskeletal:        General: No tenderness.  Skin:    General: Skin is warm and dry.  Neurological:     Mental Status: She is alert and oriented to person, place, and time.     GCS: GCS eye subscore is 4. GCS verbal subscore is 5. GCS motor subscore is 6.     Cranial Nerves: Cranial nerves are intact.     Sensory: Sensation is intact.     Motor: Motor function is intact.     Coordination: Coordination is intact.     Gait: Gait is intact.  Psychiatric:        Behavior: Behavior normal.      ED Treatments / Results  Labs (all labs ordered are listed, but only abnormal results are displayed) Labs Reviewed - No data to display  EKG None  Radiology No results found.  Procedures Procedures (including critical care time)  Medications Ordered in ED Medications - No data to display   Initial Impression / Assessment and Plan / ED Course  I have reviewed the triage vital signs and the nursing notes.  Pertinent labs & imaging results that were available during my care of the patient were reviewed by me and considered in my medical decision making (see chart for details).        85 yo F with a chief complaint of losing her balance and sliding out of bed.  She denies any injury in the fall.  States that she had a mild sore throat today.  On exam the patient is well-appearing nontoxic.  She has no complaints.  No signs of trauma.  Family stated that they were concerned about a stroke at the nursing home though they were unsure of what symptoms that she was displaying that concerned them.  My neuro exam here is benign.  Will  discharge home.   5:43 PM:  I have discussed the diagnosis/risks/treatment options with the patient and family and believe the pt to be eligible for discharge home to follow-up with PCP. We also discussed returning to the ED immediately if new or worsening sx occur. We discussed the sx which are most concerning (e.g., sudden worsening pain, fever, inability to tolerate  by mouthh) that necessitate immediate return. Medications administered to the patient during their visit and any new prescriptions provided to the patient are listed below.  Medications given during this visit Medications - No data to display   The patient appears reasonably screen and/or stabilized for discharge and I doubt any other medical condition or other [facility] requiring further screening, evaluation, or treatment in the ED at this time prior to discharge.   Final Clinical Impressions(s) / ED Diagnoses   Final diagnoses:  Fall, initial encounter  Sore throat    ED Discharge Orders    None       Melene Plan, DO 04/18/18 1743    Melene Plan, DO 04/18/18 1744

## 2018-04-18 NOTE — ED Notes (Signed)
Family @ bedside.

## 2018-04-30 ENCOUNTER — Other Ambulatory Visit: Payer: Self-pay

## 2018-04-30 ENCOUNTER — Emergency Department (HOSPITAL_COMMUNITY)
Admission: EM | Admit: 2018-04-30 | Discharge: 2018-04-30 | Disposition: A | Discharge status: Home / Self Care | Payer: Medicare Other | Attending: Emergency Medicine | Admitting: Emergency Medicine

## 2018-04-30 ENCOUNTER — Encounter (HOSPITAL_COMMUNITY): Payer: Self-pay | Admitting: Emergency Medicine

## 2018-04-30 DIAGNOSIS — R8281 Pyuria: Secondary | ICD-10-CM

## 2018-04-30 DIAGNOSIS — N390 Urinary tract infection, site not specified: Secondary | ICD-10-CM | POA: Diagnosis not present

## 2018-04-30 DIAGNOSIS — J069 Acute upper respiratory infection, unspecified: Secondary | ICD-10-CM | POA: Diagnosis not present

## 2018-04-30 DIAGNOSIS — Z79899 Other long term (current) drug therapy: Secondary | ICD-10-CM | POA: Insufficient documentation

## 2018-04-30 DIAGNOSIS — I10 Essential (primary) hypertension: Secondary | ICD-10-CM | POA: Insufficient documentation

## 2018-04-30 DIAGNOSIS — R300 Dysuria: Secondary | ICD-10-CM | POA: Diagnosis present

## 2018-04-30 LAB — URINALYSIS, ROUTINE W REFLEX MICROSCOPIC
BILIRUBIN URINE: NEGATIVE
Bacteria, UA: NONE SEEN
GLUCOSE, UA: NEGATIVE mg/dL
KETONES UR: NEGATIVE mg/dL
LEUKOCYTE UA: NEGATIVE
Nitrite: NEGATIVE
PH: 7 (ref 5.0–8.0)
Protein, ur: NEGATIVE mg/dL
Specific Gravity, Urine: 1.018 (ref 1.005–1.030)

## 2018-04-30 MED ORDER — CEFTRIAXONE SODIUM 1 G IJ SOLR
1.0000 g | Freq: Once | INTRAMUSCULAR | Status: AC
  Administered 2018-04-30: 1 g via INTRAMUSCULAR
  Filled 2018-04-30: qty 10

## 2018-04-30 MED ORDER — CEPHALEXIN 500 MG PO CAPS: 500.0000 mg | ORAL_CAPSULE | Freq: Four times a day (QID) | ORAL | 0 refills | Status: DC

## 2018-04-30 MED ORDER — STERILE WATER FOR INJECTION IJ SOLN
INTRAMUSCULAR | Status: AC
  Administered 2018-04-30: 10 mL
  Filled 2018-04-30: qty 10

## 2018-04-30 MED ORDER — CEPHALEXIN 250 MG PO CAPS: 500.0000 mg | ORAL_CAPSULE | Freq: Once | ORAL | Status: DC

## 2018-04-30 NOTE — ED Triage Notes (Signed)
Pt arrives to ED from [facility] assisted living with complaints of foul smelling breath and dark colored urine since this week. EMS reports pt ambulates with walker and is still at baseline (a&o to person and place but not time.) Facility called to confirm or rule out UTI

## 2018-04-30 NOTE — ED Notes (Signed)
Culture (gray tube) sent down with UA

## 2018-04-30 NOTE — ED Notes (Signed)
Patient verbalizes understanding of discharge instructions. Opportunity for questioning and answers were provided. Armband removed by staff, pt discharged from ED.  

## 2018-05-01 NOTE — ED Provider Notes (Signed)
Emergency Department Provider Note   I have reviewed the triage vital signs and the nursing notes.   HISTORY  Chief Complaint Urinary Tract Infection   HPI Alexandra Mckee is a 85 y.o. female here with dysuria, cloudy urine, malodorous urine and increased frequency with history of UTI's with same symptoms.  Also with 2 weeks of PND, cough, runny nose and h/o sinusitis. No fever. No respiratory distress.   No other associated or modifying symptoms.    Past Medical History:  Diagnosis Date  . Hyperlipidemia   . Hypertension   . Hypokalemia   . Stroke [facility])     Patient Active Problem List   Diagnosis Date Noted  . Stroke (HCC) 06/01/2017  . Memory loss 08/28/2013    Past Surgical History:  Procedure Laterality Date  . ABDOMINAL HYSTERECTOMY      Current Outpatient Rx  . Order #: [id number] Class: Historical Med  . Order #: [id number] Class: Normal  . Order #: [id number] Class: Normal  . Order #: [id number] Class: Historical Med  . Order #: [id number] Class: Historical Med  . Order #: [id number] Class: Print  . Order #: [id number] Class: Print    Allergies Patient has no known allergies.  Family History  Problem Relation Age of Onset  . Heart attack Father     Social History Social History   Tobacco Use  . Smoking status: Never Smoker  . Smokeless tobacco: Never Used  Substance Use Topics  . Alcohol use: No  . Drug use: No    Review of Systems  All other systems negative except as documented in the HPI. All pertinent positives and negatives as reviewed in the HPI. ____________________________________________   PHYSICAL EXAM:  VITAL SIGNS: ED Triage Vitals  Enc Vitals Group     BP 04/30/18 1349 (!) 142/76     Pulse Rate 04/30/18 1349 79     Resp 04/30/18 1349 17     Temp 04/30/18 1349 97.6 F (36.4 C)     Temp Source 04/30/18 1349 Oral     SpO2 04/30/18 1335 96 %    Constitutional: Alert and oriented. Well appearing and in no acute distress. Eyes:  Conjunctivae are normal. PERRL. EOMI. Head: Atraumatic. Nose: Congested with rhinnorhea. Mild maxillary sinus ttp. Mouth/Throat: Mucous membranes are moist.  Oropharynx non-erythematous. Neck: No stridor.  No meningeal signs.   Cardiovascular: Normal rate, regular rhythm. Good peripheral circulation. Grossly normal heart sounds.   Respiratory: Normal respiratory effort.  No retractions. Lungs CTAB. Gastrointestinal: Soft and nontender. No distention.  Musculoskeletal: No lower extremity tenderness nor edema. No gross deformities of extremities. Neurologic:  Normal speech and language. No gross focal neurologic deficits are appreciated.  Skin:  Skin is warm, dry and intact. No rash noted.   ____________________________________________   LABS (all labs ordered are listed, but only abnormal results are displayed)  Labs Reviewed  URINALYSIS, ROUTINE W REFLEX MICROSCOPIC - Abnormal; Notable for the following components:      Result Value   Color, Urine AMBER (*)    APPearance CLOUDY (*)    Hgb urine dipstick SMALL (*)    All other components within normal limits  URINE CULTURE   ____________________________________________   INITIAL IMPRESSION / ASSESSMENT AND PLAN / ED COURSE  All s/s c/w UTI with which she has a history, possibly sinusitis as well (>14 days of symptoms) so will treat for both pending urine culture. No distress or indication for further workup. No indication of sepsis or pyelonephritis.   Pertinent labs &  imaging results that were available during my care of the patient were reviewed by me and considered in my medical decision making (see chart for details).  ____________________________________________  FINAL CLINICAL IMPRESSION(S) / ED DIAGNOSES  Final diagnoses:  Upper respiratory tract infection, unspecified type  Pyuria     MEDICATIONS GIVEN DURING THIS VISIT:  Medications  cefTRIAXone (ROCEPHIN) injection 1 g (1 g Intramuscular Given 04/30/18 2304)    sterile water (preservative free) injection (10 mLs  Given 04/30/18 2305)     NEW OUTPATIENT MEDICATIONS STARTED DURING THIS VISIT:  Discharge Medication List as of 04/30/2018 10:47 PM    START taking these medications   Details  cephALEXin (KEFLEX) 500 MG capsule Take 1 capsule (500 mg total) by mouth 4 (four) times daily., Starting Mon 04/30/2018, Normal        Note:  This note was prepared with assistance of Dragon voice recognition software. Occasional wrong-word or sound-a-like substitutions may have occurred due to the inherent limitations of voice recognition software.   Marily Memos, MD 05/01/18 1446

## 2018-05-03 LAB — URINE CULTURE

## 2018-05-04 ENCOUNTER — Telehealth: Payer: Self-pay | Admitting: Emergency Medicine

## 2018-05-04 NOTE — Telephone Encounter (Signed)
Post ED Visit - Positive Culture Follow-up  Culture report reviewed by antimicrobial stewardship pharmacist: [facility] []  Enzo Bi, Pharm.D. []  Celedonio Miyamoto, [address].D., BCPS AQ-ID []  Garvin Fila, Pharm.D., BCPS []  Georgina Pillion, [address].D., BCPS []  [place], [address].D., BCPS, AAHIVP []  Estella Husk, Pharm.D., BCPS, AAHIVP []  Lysle Pearl, PharmD, BCPS []  Phillips Climes, PharmD, BCPS [x]  Agapito Games, PharmD, BCPS []  Verlan Friends, PharmD []  Mervyn Gay, PharmD, BCPS []  Vinnie Level, PharmD  [facility] []  Len Childs, PharmD []  Greer Pickerel, PharmD []  Adalberto Cole, PharmD []  Perlie Gold, Rph []  Lonell Face) Jean Rosenthal, PharmD []  Earl Many, PharmD []  Junita Push, PharmD []  Dorna Leitz, PharmD []  Terrilee Files, PharmD []  Lynann Beaver, PharmD []  Keturah Barre, PharmD []  Loralee Pacas, PharmD []  Bernadene Person, PharmD   Positive urine culture Treated with Cephalexin, organism sensitive to the same and no further patient follow-up is required at this time.  Carollee Herter Aracelie Addis 05/04/2018, 11:35 AM

## 2018-07-03 ENCOUNTER — Encounter (HOSPITAL_COMMUNITY): Payer: Self-pay

## 2018-07-03 ENCOUNTER — Observation Stay (HOSPITAL_COMMUNITY): Payer: Medicare Other

## 2018-07-03 ENCOUNTER — Emergency Department (HOSPITAL_COMMUNITY): Payer: Medicare Other

## 2018-07-03 ENCOUNTER — Other Ambulatory Visit: Payer: Self-pay

## 2018-07-03 ENCOUNTER — Inpatient Hospital Stay (HOSPITAL_COMMUNITY)
Admission: EM | Admit: 2018-07-03 | Discharge: 2018-07-09 | DRG: 069 | Disposition: A | Discharge status: Home Health | Payer: Medicare Other | Source: Skilled Nursing Facility | Attending: Internal Medicine | Admitting: Internal Medicine

## 2018-07-03 DIAGNOSIS — I10 Essential (primary) hypertension: Secondary | ICD-10-CM

## 2018-07-03 DIAGNOSIS — R29703 NIHSS score 3: Secondary | ICD-10-CM | POA: Diagnosis present

## 2018-07-03 DIAGNOSIS — R29810 Facial weakness: Secondary | ICD-10-CM | POA: Diagnosis present

## 2018-07-03 DIAGNOSIS — I639 Cerebral infarction, unspecified: Secondary | ICD-10-CM

## 2018-07-03 DIAGNOSIS — Z8249 Family history of ischemic heart disease and other diseases of the circulatory system: Secondary | ICD-10-CM

## 2018-07-03 DIAGNOSIS — Z1159 Encounter for screening for other viral diseases: Secondary | ICD-10-CM

## 2018-07-03 DIAGNOSIS — G8194 Hemiplegia, unspecified affecting left nondominant side: Secondary | ICD-10-CM | POA: Diagnosis present

## 2018-07-03 DIAGNOSIS — Z7902 Long term (current) use of antithrombotics/antiplatelets: Secondary | ICD-10-CM

## 2018-07-03 DIAGNOSIS — G459 Transient cerebral ischemic attack, unspecified: Secondary | ICD-10-CM | POA: Diagnosis present

## 2018-07-03 DIAGNOSIS — Z90710 Acquired absence of both cervix and uterus: Secondary | ICD-10-CM

## 2018-07-03 DIAGNOSIS — E876 Hypokalemia: Secondary | ICD-10-CM | POA: Diagnosis present

## 2018-07-03 DIAGNOSIS — Z79899 Other long term (current) drug therapy: Secondary | ICD-10-CM

## 2018-07-03 DIAGNOSIS — Z66 Do not resuscitate: Secondary | ICD-10-CM | POA: Diagnosis present

## 2018-07-03 DIAGNOSIS — Z8673 Personal history of transient ischemic attack (TIA), and cerebral infarction without residual deficits: Secondary | ICD-10-CM

## 2018-07-03 DIAGNOSIS — R7303 Prediabetes: Secondary | ICD-10-CM | POA: Diagnosis present

## 2018-07-03 DIAGNOSIS — E785 Hyperlipidemia, unspecified: Secondary | ICD-10-CM | POA: Diagnosis present

## 2018-07-03 LAB — CBC
HCT: 40.6 % (ref 36.0–46.0)
Hemoglobin: 12.9 g/dL (ref 12.0–15.0)
MCH: 29.6 pg (ref 26.0–34.0)
MCHC: 31.8 g/dL (ref 30.0–36.0)
MCV: 93.1 fL (ref 80.0–100.0)
Platelets: 254 10*3/uL (ref 150–400)
RBC: 4.36 MIL/uL (ref 3.87–5.11)
RDW: 13.7 % (ref 11.5–15.5)
WBC: 8.5 10*3/uL (ref 4.0–10.5)
nRBC: 0 % (ref 0.0–0.2)

## 2018-07-03 LAB — COMPREHENSIVE METABOLIC PANEL
ALT: 32 U/L (ref 0–44)
AST: 53 U/L — ABNORMAL HIGH (ref 15–41)
Albumin: 3.2 g/dL — ABNORMAL LOW (ref 3.5–5.0)
Alkaline Phosphatase: 106 U/L (ref 38–126)
Anion gap: 8 (ref 5–15)
BUN: 15 mg/dL (ref 8–23)
CO2: 29 mmol/L (ref 22–32)
Calcium: 9.8 mg/dL (ref 8.9–10.3)
Chloride: 106 mmol/L (ref 98–111)
Creatinine, Ser: 0.86 mg/dL (ref 0.44–1.00)
GFR calc Af Amer: >60 mL/min (ref >60)
GFR calc non Af Amer: 57 mL/min — ABNORMAL LOW (ref >60)
Glucose, Bld: 118 mg/dL — ABNORMAL HIGH (ref 70–99)
Potassium: 3.7 mmol/L (ref 3.5–5.1)
Sodium: 143 mmol/L (ref 135–145)
Total Bilirubin: 0.7 mg/dL (ref 0.3–1.2)
Total Protein: 7.9 g/dL (ref 6.5–8.1)

## 2018-07-03 LAB — DIFFERENTIAL
Abs Immature Granulocytes: 0.02 10*3/uL (ref 0.00–0.07)
Basophils Absolute: 0.1 10*3/uL (ref 0.0–0.1)
Basophils Relative: 1 %
Eosinophils Absolute: 0.2 10*3/uL (ref 0.0–0.5)
Eosinophils Relative: 2 %
Immature Granulocytes: 0 %
Lymphocytes Relative: 26 %
Lymphs Abs: 2.2 10*3/uL (ref 0.7–4.0)
Monocytes Absolute: 0.6 10*3/uL (ref 0.1–1.0)
Monocytes Relative: 7 %
Neutro Abs: 5.4 10*3/uL (ref 1.7–7.7)
Neutrophils Relative %: 64 %

## 2018-07-03 LAB — APTT: aPTT: 22 seconds — ABNORMAL LOW (ref 24–36)

## 2018-07-03 LAB — PROTIME-INR
INR: 1 (ref 0.8–1.2)
Prothrombin Time: 13.3 seconds (ref 11.4–15.2)

## 2018-07-03 LAB — I-STAT CHEM 8, ED
BUN: 19 mg/dL (ref 8–23)
Calcium, Ion: 1.2 mmol/L (ref 1.15–1.40)
Chloride: 104 mmol/L (ref 98–111)
Creatinine, Ser: 0.7 mg/dL (ref 0.44–1.00)
Glucose, Bld: 112 mg/dL — ABNORMAL HIGH (ref 70–99)
HCT: 37 % (ref 36.0–46.0)
Hemoglobin: 12.6 g/dL (ref 12.0–15.0)
Potassium: 3.7 mmol/L (ref 3.5–5.1)
Sodium: 141 mmol/L (ref 135–145)
TCO2: 30 mmol/L (ref 22–32)

## 2018-07-03 LAB — CBG MONITORING, ED
Glucose-Capillary: 100 mg/dL — ABNORMAL HIGH (ref 70–99)
Glucose-Capillary: 107 mg/dL — ABNORMAL HIGH (ref 70–99)

## 2018-07-03 LAB — SARS CORONAVIRUS 2 BY RT PCR (HOSPITAL ORDER, PERFORMED IN ~~LOC~~ HOSPITAL LAB): SARS Coronavirus 2: NEGATIVE

## 2018-07-03 MED ORDER — SODIUM CHLORIDE 0.9% FLUSH: 3.0000 mL | Freq: Once | INTRAVENOUS | Status: DC

## 2018-07-03 MED ORDER — MIRTAZAPINE 15 MG PO TABS
15.0000 mg | ORAL_TABLET | Freq: Every day | ORAL | Status: DC
  Administered 2018-07-03 – 2018-07-08 (×6): 15 mg via ORAL
  Filled 2018-07-03 (×6): qty 1

## 2018-07-03 MED ORDER — ONDANSETRON HCL 4 MG PO TABS: 4.0000 mg | ORAL_TABLET | Freq: Four times a day (QID) | ORAL | Status: DC | PRN

## 2018-07-03 MED ORDER — SODIUM CHLORIDE 0.9 % IV SOLN: 250.0000 mL | INTRAVENOUS | Status: DC | PRN

## 2018-07-03 MED ORDER — ATORVASTATIN CALCIUM 80 MG PO TABS
80.0000 mg | ORAL_TABLET | Freq: Every day | ORAL | Status: DC
  Administered 2018-07-03 – 2018-07-08 (×6): 80 mg via ORAL
  Filled 2018-07-03 (×6): qty 1

## 2018-07-03 MED ORDER — SODIUM CHLORIDE 0.9% FLUSH
3.0000 mL | Freq: Two times a day (BID) | INTRAVENOUS | Status: DC
  Administered 2018-07-03 – 2018-07-09 (×9): 3 mL via INTRAVENOUS

## 2018-07-03 MED ORDER — CLOPIDOGREL BISULFATE 75 MG PO TABS
75.0000 mg | ORAL_TABLET | Freq: Every day | ORAL | Status: DC
  Administered 2018-07-04 – 2018-07-09 (×4): 75 mg via ORAL
  Filled 2018-07-03 (×5): qty 1

## 2018-07-03 MED ORDER — ACETAMINOPHEN 650 MG RE SUPP: 650.0000 mg | Freq: Four times a day (QID) | RECTAL | Status: DC | PRN

## 2018-07-03 MED ORDER — ACETAMINOPHEN 325 MG PO TABS: 650.0000 mg | ORAL_TABLET | Freq: Four times a day (QID) | ORAL | Status: DC | PRN

## 2018-07-03 MED ORDER — SENNOSIDES-DOCUSATE SODIUM 8.6-50 MG PO TABS
1.0000 | ORAL_TABLET | Freq: Every evening | ORAL | Status: DC | PRN
  Administered 2018-07-04: 1 via ORAL
  Filled 2018-07-03: qty 1

## 2018-07-03 MED ORDER — STROKE: EARLY STAGES OF RECOVERY BOOK
Freq: Once | Status: AC
  Administered 2018-07-03: 19:00:00
  Filled 2018-07-03: qty 1

## 2018-07-03 MED ORDER — ENOXAPARIN SODIUM 40 MG/0.4ML ~~LOC~~ SOLN
40.0000 mg | SUBCUTANEOUS | Status: DC
  Administered 2018-07-03 – 2018-07-08 (×6): 40 mg via SUBCUTANEOUS
  Filled 2018-07-03 (×6): qty 0.4

## 2018-07-03 MED ORDER — SODIUM CHLORIDE 0.9% FLUSH: 3.0000 mL | INTRAVENOUS | Status: DC | PRN

## 2018-07-03 MED ORDER — ONDANSETRON HCL 4 MG/2ML IJ SOLN: 4.0000 mg | Freq: Four times a day (QID) | INTRAMUSCULAR | Status: DC | PRN

## 2018-07-03 MED ORDER — GADOBUTROL 1 MMOL/ML IV SOLN
6.0000 mL | Freq: Once | INTRAVENOUS | Status: AC | PRN
  Administered 2018-07-03: 6 mL via INTRAVENOUS

## 2018-07-03 NOTE — ED Notes (Signed)
ED TO INPATIENT HANDOFF REPORT  ED Nurse Name and Phone #: [phone] Caryn Bee RN  S Name/Age/Gender Alexandra Mckee 85 y.o. female Room/Bed: RESUSC/RESUSC  Code Status   Code Status: Prior  Home/SNF/Other Nursing Home Patient oriented to: self Is this baseline? Yes   Triage Complete: Triage complete  Chief Complaint Code Stroke  Triage Note Per [facility], [facility]. LSN 0830 this morning when pt was working with PT. Staff entered pt's room and found pt to have right sided facial droop and right sided weakness at 1200. Upon arrival here pt has no drift, no weakness on either side, no aphasia/slurred speech, no visual changes. Pt did not answer 2 questions correctly, noted to have left sided facial droop. VSS   Allergies No Known Allergies  Level of Care/Admitting Diagnosis ED Disposition    ED Disposition Condition Comment   Admit  Hospital Area: [facility] [[id number]]  Level of Care: Telemetry Medical [104]  I expect the patient will be discharged within 24 hours: Yes  LOW acuity---Tx typically complete <24 hrs---ACUTE conditions typically can be evaluated <24 hours---LABS likely to return to acceptable levels <24 hours---IS near functional baseline---EXPECTED to return to current living arrangement---NOT newly hypoxic: Does not meet criteria for 5C-Observation unit  Covid Evaluation: N/A  Diagnosis: TIA (transient ischemic attack) [670141]  Admitting Physician: Noralee Stain [phone]  Attending Physician: Noralee Stain [phone]  PT Class (Do Not Modify): Observation [104]  PT Acc Code (Do Not Modify): Observation [10022]       B Medical/Surgery History Past Medical History:  Diagnosis Date  . Hyperlipidemia   . Hypertension   . Hypokalemia   . Stroke [facility])    Past Surgical History:  Procedure Laterality Date  . ABDOMINAL HYSTERECTOMY       A IV Location/Drains/Wounds Patient Lines/Drains/Airways Status   Active Line/Drains/Airways     Name:   Placement date:   Placement time:   Site:   Days:   Peripheral IV 07/03/18 Right Antecubital   07/03/18    1301    Antecubital   less than 1   External Urinary Catheter   07/03/18    1251    -   less than 1          Intake/Output Last 24 hours No intake or output data in the 24 hours ending 07/03/18 1451  Labs/Imaging Results for orders placed or performed during the hospital encounter of 07/03/18 (from the past 48 hour(s))  Protime-INR     Status: None   Collection Time: 07/03/18 12:21 PM  Result Value Ref Range   Prothrombin Time 13.3 11.4 - 15.2 seconds   INR 1.0 0.8 - 1.2    Comment: (NOTE) INR goal varies based on device and disease states. Performed at [facility], [address] [place], [place] [address]   APTT     Status: Abnormal   Collection Time: 07/03/18 12:21 PM  Result Value Ref Range   aPTT 22 (L) 24 - 36 seconds    Comment: Performed at [facility], [address], [place], [place] [address]  CBC     Status: None   Collection Time: 07/03/18 12:21 PM  Result Value Ref Range   WBC 8.5 4.0 - 10.5 K/uL   RBC 4.36 3.87 - 5.11 MIL/uL   Hemoglobin 12.9 12.0 - 15.0 g/dL   HCT 61.5 37.9 - 43.2 %   MCV 93.1 80.0 - 100.0 fL   MCH  29.6 26.0 - 34.0 pg   MCHC 31.8 30.0 - 36.0 g/dL   RDW 16.113.7 09.611.5 - 04.515.5 %   Platelets 254 150 - 400 K/uL   nRBC 0.0 0.0 - 0.2 %    Comment: Performed at [facility], [address] [place], [place] [address]  Differential     Status: None   Collection Time: 07/03/18 12:21 PM  Result Value Ref Range   Neutrophils Relative % 64 %   Neutro Abs 5.4 1.7 - 7.7 K/uL   Lymphocytes Relative 26 %   Lymphs Abs 2.2 0.7 - 4.0 K/uL   Monocytes Relative 7 %   Monocytes Absolute 0.6 0.1 - 1.0 K/uL   Eosinophils Relative 2 %   Eosinophils Absolute 0.2 0.0 - 0.5 K/uL   Basophils Relative 1 %   Basophils Absolute 0.1 0.0 - 0.1 K/uL   Immature Granulocytes 0 %   Abs Immature Granulocytes 0.02 0.00 - 0.07 K/uL     Comment: Performed at [facility], [address] [place], [place] [address]  Comprehensive metabolic panel     Status: Abnormal   Collection Time: 07/03/18 12:21 PM  Result Value Ref Range   Sodium 143 135 - 145 mmol/L   Potassium 3.7 3.5 - 5.1 mmol/L   Chloride 106 98 - 111 mmol/L   CO2 29 22 - 32 mmol/L   Glucose, Bld 118 (H) 70 - 99 mg/dL   BUN 15 8 - 23 mg/dL   Creatinine, Ser 8.290.86 0.44 - 1.00 mg/dL   Calcium 9.8 8.9 - 56.210.3 mg/dL   Total Protein 7.9 6.5 - 8.1 g/dL   Albumin 3.2 (L) 3.5 - 5.0 g/dL   AST 53 (H) 15 - 41 U/L   ALT 32 0 - 44 U/L   Alkaline Phosphatase 106 38 - 126 U/L   Total Bilirubin 0.7 0.3 - 1.2 mg/dL   GFR calc non Af Amer 57 (L) >60 mL/min   GFR calc Af Amer >60 >60 mL/min   Anion gap 8 5 - 15    Comment: Performed at [facility], [address] [place], [place] [address]  CBG monitoring, ED     Status: Abnormal   Collection Time: 07/03/18 12:21 PM  Result Value Ref Range   Glucose-Capillary 107 (H) 70 - 99 mg/dL   Comment 1 Notify RN    Comment 2 Document in Chart   CBG monitoring, ED     Status: Abnormal   Collection Time: 07/03/18 12:23 PM  Result Value Ref Range   Glucose-Capillary 100 (H) 70 - 99 mg/dL   Comment 1 Notify RN    Comment 2 Document in Chart   I-stat chem 8, ED ([facility] and [facility] only)     Status: Abnormal   Collection Time: 07/03/18 12:41 PM  Result Value Ref Range   Sodium 141 135 - 145 mmol/L   Potassium 3.7 3.5 - 5.1 mmol/L   Chloride 104 98 - 111 mmol/L   BUN 19 8 - 23 mg/dL   Creatinine, Ser 5.780.70 0.44 - 1.00 mg/dL   Glucose, Bld 469112 (H) 70 - 99 mg/dL   Calcium, Ion 6.291.20 1.15 - 1.40 mmol/L   TCO2 30 22 - 32 mmol/L   Hemoglobin 12.6 12.0 - 15.0 g/dL   HCT 52.837.0 41.336.0 - 24.446.0 %  SARS Coronavirus 2 (CEPHEID - Performed in [facility] hospital lab), Hosp Order     Status: None   Collection Time: 07/03/18 12:49  PM  Result Value Ref Range   SARS Coronavirus 2 NEGATIVE NEGATIVE    Comment: (NOTE) If result is  NEGATIVE SARS-CoV-2 target nucleic acids are NOT DETECTED. The SARS-CoV-2 RNA is generally detectable in upper and lower  respiratory specimens during the acute phase of infection. The lowest  concentration of SARS-CoV-2 viral copies this assay can detect is 250  copies / mL. A negative result does not preclude SARS-CoV-2 infection  and should not be used as the sole basis for treatment or other  patient management decisions.  A negative result may occur with  improper specimen collection / handling, submission of specimen other  than nasopharyngeal swab, presence of viral mutation(s) within the  areas targeted by this assay, and inadequate number of viral copies  (<250 copies / mL). A negative result must be combined with clinical  observations, patient history, and epidemiological information. If result is POSITIVE SARS-CoV-2 target nucleic acids are DETECTED. The SARS-CoV-2 RNA is generally detectable in upper and lower  respiratory specimens dur ing the acute phase of infection.  Positive  results are indicative of active infection with SARS-CoV-2.  Clinical  correlation with patient history and other diagnostic information is  necessary to determine patient infection status.  Positive results do  not rule out bacterial infection or co-infection with other viruses. If result is PRESUMPTIVE POSTIVE SARS-CoV-2 nucleic acids MAY BE PRESENT.   A presumptive positive result was obtained on the submitted specimen  and confirmed on repeat testing.  While 2019 novel coronavirus  (SARS-CoV-2) nucleic acids may be present in the submitted sample  additional confirmatory testing may be necessary for epidemiological  and / or clinical management purposes  to differentiate between  SARS-CoV-2 and other Sarbecovirus currently known to infect humans.  If clinically indicated additional testing with an alternate test  methodology [phone]) is advised. The SARS-CoV-2 RNA is generally  detectable  in upper and lower respiratory sp ecimens during the acute  phase of infection. The expected result is Negative. Fact Sheet for Patients:  BoilerBrush.com.cy Fact Sheet for Healthcare Providers: https://pope.com/ This test is not yet approved or cleared by the [place] FDA and has been authorized for detection and/or diagnosis of SARS-CoV-2 by FDA under an Emergency Use Authorization (EUA).  This EUA will remain in effect (meaning this test can be used) for the duration of the COVID-19 declaration under Section 564(b)(1) of the Act, 21 U.S.C. section 360bbb-3(b)(1), unless the authorization is terminated or revoked sooner. Performed at [facility], [address], [place], [place] [address]    Dg Chest Portable 1 View  Result Date: 07/03/2018 CLINICAL DATA:  Weakness EXAM: PORTABLE CHEST 1 VIEW COMPARISON:  06/01/2017 FINDINGS: Cardiac shadows within normal limits. Mitral valve calcifications are noted. The lungs are clear bilaterally. No acute bony abnormality is seen. IMPRESSION: No acute abnormality noted. Electronically Signed   By: Alcide Clever M.D.   On: 07/03/2018 13:01   Ct Head Code Stroke Wo Contrast  Result Date: 07/03/2018 CLINICAL DATA:  Code stroke.  Left-sided weakness EXAM: CT HEAD WITHOUT CONTRAST TECHNIQUE: Contiguous axial images were obtained from the base of the skull through the vertex without intravenous contrast. COMPARISON:  MRI head 06/01/2017, CT 06/01/2017 FINDINGS: Brain: Moderate atrophy. Moderate chronic microvascular ischemic change in the white matter. Hypodensity right cerebellum consistent with chronic subarachnoid space. Negative for acute infarct, hemorrhage, or mass. Vascular: Negative for hyperdense vessel. Atherosclerotic calcification Skull: Negative Sinuses/Orbits: Mild mucosal edema paranasal sinuses. Right cataract surgery. Other:  None ASPECTS (Alberta Stroke Program Early CT Score) -  Ganglionic level infarction (caudate, lentiform nuclei, internal capsule, insula, M1-M3 cortex): 7 - Supraganglionic infarction (M4-M6 cortex): 3 Total score (0-10 with 10 being normal): 10 IMPRESSION: 1. No acute abnormality 2. ASPECTS is 10 3. Moderate atrophy and moderate chronic microvascular ischemic change 4. These results were called by telephone at the time of interpretation on 07/03/2018 at 12:40 pm to Dr. Amada Jupiter , who verbally acknowledged these results. Electronically Signed   By: Marlan Palau M.D.   On: 07/03/2018 12:41    Pending Labs Unresulted Labs (From admission, onward)    Start     Ordered   07/03/18 1241  Urinalysis, Routine w reflex microscopic  ONCE - STAT,   STAT     07/03/18 1241   Signed and Held  CBC  Tomorrow morning,   R     Signed and Held   Signed and Held  Basic metabolic panel  Tomorrow morning,   R     Signed and Held   Signed and Held  Hemoglobin A1c  Once,   R     Signed and Held   Signed and Held  Lipid panel  Once,   R     Signed and Held   Signed and Held  Hemoglobin A1c  Tomorrow morning,   R     Signed and Held   Signed and Held  Lipid panel  Tomorrow morning,   R    Comments:  Fasting    Signed and Held          Vitals/Pain Today's Vitals   07/03/18 1415 07/03/18 1430 07/03/18 1445 07/03/18 1451  BP: (!) 156/71 (!) 155/63 (!) 145/67   Pulse: 74 74 72   Resp: Temp:      TempSrc:      SpO2: 99% 100% 99%   Weight:      PainSc:    0-No pain    Isolation Precautions No active isolations  Medications Medications  sodium chloride flush (NS) 0.9 % injection 3 mL (3 mLs Intravenous Not Given 07/03/18 1247)    Mobility walks with person assist High fall risk   Focused Assessments Cardiac Assessment Handoff:  Cardiac Rhythm: Normal sinus rhythm No results found for: CKTOTAL, CKMB, CKMBINDEX, TROPONINI No results found for: DDIMER Does the Patient currently have chest pain? No   , Neuro Assessment  Handoff:  Swallow screen pass? No  Cardiac Rhythm: Normal sinus rhythm NIH Stroke Scale ( + Modified Stroke Scale Criteria)  Interval: Initial Level of Consciousness (1a.)   : Alert, keenly responsive LOC Questions (1b. )   +: Answers one question correctly LOC Commands (1c. )   + : Performs both tasks correctly Best Gaze (2. )  +: Normal Visual (3. )  +: No visual loss Facial Palsy (4. )    : Minor paralysis(left) Motor Arm, Left (5a. )   +: No drift Motor Arm, Right (5b. )   +: No drift Motor Leg, Left (6a. )   +: No drift Motor Leg, Right (6b. )   +: No drift Limb Ataxia (7. ): Absent Sensory (8. )   +: Normal, no sensory loss Best Language (9. )   +: No aphasia Dysarthria (10. ): Normal Extinction/Inattention (11.)   +: No Abnormality Modified SS Total  +: 1 Complete NIHSS TOTAL: 2 Last date known well: 07/03/18 Last time known well: 0830 Neuro Assessment: Exceptions to [facility]  Neuro Checks:   Initial (07/03/18 1230)  Last Documented NIHSS Modified Score: 1 (07/03/18 1300) Has TPA been given? No If patient is a Neuro Trauma and patient is going to OR before floor call report to 4N Charge nurse: [phone] or [phone]  , Pulmonary Assessment Handoff:  Lung sounds:   O2 Device: Room Air        R Recommendations: See Admitting Provider Note  Report given to: RN   Additional Notes: [facility], [facility]. LSN 0830 this morning when pt was working with PT. Staff entered pt's room and found pt to have right sided facial droop and right sided weakness at 1200. Upon arrival here pt has no drift, no weakness on either side, no aphasia/slurred speech, no visual changes. Pt did not answer 2 questions correctly, noted to have left sided facial droop. VSS

## 2018-07-03 NOTE — ED Triage Notes (Signed)
[facility], pt [facility]. LSN 0830 this morning when pt was working with PT. Staff entered pt's room and found pt to have right sided facial droop and right sided weakness at 1200. Upon arrival here pt has no drift, no weakness on either side, no aphasia/slurred speech, no visual changes. Pt did not answer 2 questions correctly, noted to have left sided facial droop. VSS

## 2018-07-03 NOTE — Code Documentation (Signed)
85yo female arriving to [facility] via [facility] at 1218. Patient from [facility] where she was LKW at 0830 working with PT. She was later found at 1130 with right sided weakness and EMS was called. EMS assessed right facial droop and right sided weakness and activated a code stroke. Stroke team to the bedside on patient arrival. Labs drawn and patient cleared for CT by Dr. Rubin Payor. Patient to CT with team. CT completed. NIHSS 3, see documentation for details and code stroke times. Patient unable to accurately answer NIHSS questions and has left facial droop on exam. Patient's symptoms are too mild to treat with tPA at this time, however, remains in the window to treat with tPA until 1300 should symptoms worsen. Patient to be admitted for stroke work-up. Bedside handoff with ED RN Caryn Bee.

## 2018-07-03 NOTE — ED Notes (Signed)
Nurse Navigator updated daughter on pt room assignment and gave phone numbers to help her reach her mother upstairs. Gave pt something to drink also.

## 2018-07-03 NOTE — Progress Notes (Signed)
During skin assessment patient noted to have stage 2 sore on left hip covered with foam dsg for nursing facility

## 2018-07-03 NOTE — Consult Note (Addendum)
Neurology Consultation  Reason for Consult: Code stroke Referring Physician: Rubin Payor  CC: Right facial droop and confusion  [facility] is [facility] HPI: Alexandra Mckee is a 85 y.o. female with history of hypertension, hyperlipidemia, hypokalemia and stroke in the past.  Patient apparently was last seen at the nursing home at 8:30 in the morning as she was her normal self.  She is a full ADL at the nursing home.  She is receiving physical therapy due to decreased strength.  Physical therapy came in to work with her at approximately 11:00 and noticed that she had a facial droop and was confused.  EMS was called and patient was brought to [facility] as a code stroke.  Exam was immediately done and noted that she was confused and had a facial droop giving her NIH stroke scale of 3.  Patient symptoms are too mild to treat with TPA.  LKW: 8:30 AM on 07/03/2018 tpa given?: no, symptoms to mild to treat Premorbid modified Rankin scale (mRS): 5 NIH stroke score: 3 ROS:  Unable to obtain due to altered mental status.   Past Medical History:  Diagnosis Date  . Hyperlipidemia   . Hypertension   . Hypokalemia   . Stroke [facility])      Family History  Problem Relation Age of Onset  . Heart attack Father    Social History:   reports that she has never smoked. She has never used smokeless tobacco. She reports that she does not drink alcohol or use drugs.  Medications  Current Facility-Administered Medications:  .  sodium chloride flush (NS) 0.9 % injection 3 mL, 3 mL, Intravenous, Once, Benjiman Core, MD  Current Outpatient Medications:  .  acetaminophen (TYLENOL) 325 MG tablet, Take 650 mg by mouth every 6 (six) hours as needed for mild pain or headache., Disp: , Rfl:  .  atorvastatin (LIPITOR) 80 MG tablet, Take 1 tablet (80 mg total) by mouth daily at 6 PM. (Patient taking differently: Take 80 mg by mouth daily at 8 pm. ), Disp: 30 tablet, Rfl: 6 .  cephALEXin  (KEFLEX) 500 MG capsule, Take 1 capsule (500 mg total) by mouth 4 (four) times daily., Disp: 28 capsule, Rfl: 0 .  clopidogrel (PLAVIX) 75 MG tablet, Take 1 tablet (75 mg total) by mouth daily., Disp: 30 tablet, Rfl: 6 .  hydrochlorothiazide (HYDRODIURIL) 25 MG tablet, Take 25 mg by mouth daily., Disp: , Rfl:  .  mirtazapine (REMERON) 15 MG tablet, Take 15 mg by mouth at bedtime., Disp: , Rfl:  .  potassium chloride (K-DUR) 10 MEQ tablet, Take 1 tablet (10 mEq total) by mouth 2 (two) times daily., Disp: 10 tablet, Rfl: 0   Exam: Current vital signs: BP (!) 154/68 (BP Location: Right Arm)   Pulse 75   Temp (!) 97.5 F (36.4 C) (Oral)   Resp 15   Wt 59.2 kg   SpO2 100%   BMI 22.40 kg/m  Vital signs in last 24 hours: Temp:  [97.5 F (36.4 C)] 97.5 F (36.4 C) (05/12 1244) Pulse Rate:  [75] 75 (05/12 1244) Resp:  [15] 15 (05/12 1244) BP: (154)/(68) 154/68 (05/12 1244) SpO2:  [100 %] 100 % (05/12 1244) Weight:  [59.2 kg] 59.2 kg (05/12 1200)  Physical Exam  Constitutional: Appears well-developed and well-nourished.  Psych: Affect appropriate to situation Eyes: No scleral injection HENT: No OP obstrucion Head: Normocephalic.  Cardiovascular: Normal rate and regular rhythm.  Respiratory: Effort normal, non-labored breathing GI:  Soft.  No distension. There is no tenderness.  Skin: WDI  Neuro: Mental Status: Patient is awake, alert, however she is not oriented to her birthday or month or age   Cranial Nerves: II: Visual Fields are full.  III,IV, VI: EOMI without ptosis or diploplia. Pupils equal, round and reactive to light V: Facial sensation is symmetric to temperature VII: Left facial droop VIII: hearing is intact to voice X: Palat elevates symmetrically XI: Shoulder shrug is symmetric. XII: tongue is midline without atrophy or fasciculations.  Motor: Tone is normal. Bulk is normal.  No droop and is 4/5 throughout Sensory: Sensation is symmetric to light touch and  temperature in the arms and legs. Deep Tendon Reflexes: No Achilles, 1+ at the knees and 2+ in the upper extremities Plantars: Toes are downgoing bilaterally. Cerebellar: FNF and HKS are intact bilaterally  Labs I have reviewed labs in epic and the results pertinent to this consultation are:   CBC    Component Value Date/Time   WBC 8.5 07/03/2018 1221   RBC 4.36 07/03/2018 1221   HGB 12.6 07/03/2018 1241   HCT 37.0 07/03/2018 1241   PLT 254 07/03/2018 1221   MCV 93.1 07/03/2018 1221   MCH 29.6 07/03/2018 1221   MCHC 31.8 07/03/2018 1221   RDW 13.7 07/03/2018 1221   LYMPHSABS 2.2 07/03/2018 1221   MONOABS 0.6 07/03/2018 1221   EOSABS 0.2 07/03/2018 1221   BASOSABS 0.1 07/03/2018 1221    CMP     Component Value Date/Time   NA 141 07/03/2018 1241   K 3.7 07/03/2018 1241   CL 104 07/03/2018 1241   CO2 27 06/01/2017 1054   GLUCOSE 112 (H) 07/03/2018 1241   BUN 19 07/03/2018 1241   CREATININE 0.70 07/03/2018 1241   CALCIUM 9.3 06/01/2017 1054   PROT 7.1 06/01/2017 1054   ALBUMIN 3.7 06/01/2017 1054   AST 25 06/01/2017 1054   ALT 14 06/01/2017 1054   ALKPHOS 71 06/01/2017 1054   BILITOT 0.4 06/01/2017 1054   GFRNONAA >60 06/01/2017 2147   GFRAA >60 06/01/2017 2147    Lipid Panel     Component Value Date/Time   CHOL 280 (H) 06/02/2017 0404   TRIG 172 (H) 06/02/2017 0404   HDL 49 06/02/2017 0404   CHOLHDL 5.7 06/02/2017 0404   VLDL 34 06/02/2017 0404   LDLCALC 197 (H) 06/02/2017 0404     Imaging I have reviewed the images obtained:  CT-scan of the brain  MRI examination of the brain  Felicie Mornavid Smith PA-C [facility] [phone]  M-F  (9:00 am- 5:00 PM)  07/03/2018, 12:49 PM    I have seen the patient and reviewed the above note.  She was last noted to be well at 830 when she was watching TV, and subsequently a facial droop was noted.  She was initially felt to have right-sided weakness, but if that was present initially, it has subsequently  cleared.  Assessment:  85 year old female [facility] as a code stroke.  Significant findings are predominantly right facial droop.  Patient's symptoms were too mild to treat with TPA.  At this time patient will need a stroke work-up for stroke prevention.  Recommend # MRI of the brain without contrast #MRA Head and neck  #Transthoracic Echo,  #10 you patient on Plavix 75 mg daily,  #continue Atorvastatin 80 mg/other high intensity statin # BP goal: permissive HTN upto 220/120 mmHg # HBAIC and Lipid profile # Telemetry monitoring # Frequent neuro  checks # NPO until passes stroke swallow screen # please page stroke NP  Or  PA  Or MD from 8am -4 pm  as this patient from this time will be  followed by the stroke.   You can look them up on [email or web address]  Password [email or web address]   Ritta Slot, MD [facility] [phone]  If 7pm- 7am, please page neurology on call as listed in AMION.

## 2018-07-03 NOTE — ED Notes (Signed)
Portable in room.  

## 2018-07-03 NOTE — Progress Notes (Signed)
Patient place in 3w-31 at this time

## 2018-07-03 NOTE — ED Notes (Signed)
CBG 107. Notified Caryn Bee, RN.

## 2018-07-03 NOTE — ED Notes (Signed)
Spoke with patients daughter, Olegario Messier and notified her patient would be admitted to the hospital for mild stroke and frequent monitoring. Daughter agreeable with plan and thanked this Clinical research associate for the update.

## 2018-07-03 NOTE — Progress Notes (Signed)
Obtained report from ED RN. Patient will be coming to 3w-31

## 2018-07-03 NOTE — ED Notes (Signed)
Pt CBG was 100, notified Kevin(RN)

## 2018-07-03 NOTE — ED Provider Notes (Signed)
[facility] EMERGENCY DEPARTMENT Provider Note   CSN: [id number] Arrival date & time: 07/03/18  1218  An emergency department physician performed an initial assessment on this suspected stroke patient at 1225(Alexandra Mckee).  History   Chief Complaint Chief Complaint  Patient presents with  . Code Stroke    HPI Alexandra Mckee is a 85 y.o. female.     HPI Patient presents as a code stroke.  [facility].  Reportedly was normal at 830 for physical therapy.  Around 20 minutes prior to arrival patient was found to have right-sided facial droop and questionable right-sided weakness.  EMS states patient normally walks and talks although she may use a walker.  Calling care check shows that she is normally total care.  CBG was normal.  Had a stroke a year ago. Past Medical History:  Diagnosis Date  . Hyperlipidemia   . Hypertension   . Hypokalemia   . Stroke [facility])     Patient Active Problem List   Diagnosis Date Noted  . Stroke ([facility]) 06/01/2017  . Memory loss 08/28/2013    Past Surgical History:  Procedure Laterality Date  . ABDOMINAL HYSTERECTOMY       OB History   No obstetric history on file.      Home Medications    Prior to Admission medications   Medication Sig Start Date End Date Taking? Authorizing Provider  acetaminophen (TYLENOL) 325 MG tablet Take 650 mg by mouth every 6 (six) hours as needed for mild pain or headache.    [provider]  atorvastatin (LIPITOR) 80 MG tablet Take 1 tablet (80 mg total) by mouth daily at 6 PM. Patient taking differently: Take 80 mg by mouth daily at 8 pm.  06/03/17   Danford, Suann Larry, MD  cephALEXin (KEFLEX) 500 MG capsule Take 1 capsule (500 mg total) by mouth 4 (four) times daily. 04/30/18   Mesner, Corene Cornea, MD  clopidogrel (PLAVIX) 75 MG tablet Take 1 tablet (75 mg total) by mouth daily. 06/04/17   Danford, Suann Larry, MD  hydrochlorothiazide (HYDRODIURIL) 25 MG tablet Take 25 mg by mouth  daily.    [provider]  mirtazapine (REMERON) 15 MG tablet Take 15 mg by mouth at bedtime.    [provider]  potassium chloride (K-DUR) 10 MEQ tablet Take 1 tablet (10 mEq total) by mouth 2 (two) times daily. 08/01/13   Fredia Sorrow, MD    Family History Family History  Problem Relation Age of Onset  . Heart attack Father     Social History Social History   Tobacco Use  . Smoking status: Never Smoker  . Smokeless tobacco: Never Used  Substance Use Topics  . Alcohol use: No  . Drug use: No     Allergies   Patient has no known allergies.   Review of Systems Review of Systems  Constitutional: Negative for appetite change.  Respiratory: Negative for shortness of breath.   Cardiovascular: Negative for chest pain.  Gastrointestinal: Negative for abdominal pain.  Genitourinary: Negative for dysuria.  Musculoskeletal: Negative for back pain.  Neurological: Positive for weakness.  Psychiatric/Behavioral: Negative for confusion.     Physical Exam Updated Vital Signs BP (!) 147/67   Pulse 73   Temp (!) 97.5 F (36.4 C) (Oral)   Resp 14   Wt 59.2 kg   SpO2 98%   BMI 22.40 kg/m   Physical Exam Vitals signs and nursing note reviewed.  Constitutional:  Appearance: Normal appearance.  HENT:     Head:     Comments: Eye movements intact.  Right-sided facial droop  Eyes:     Extraocular Movements: Extraocular movements intact.  Cardiovascular:     Rate and Rhythm: Regular rhythm.     Heart sounds: No murmur.  Pulmonary:     Breath sounds: Normal breath sounds.  Abdominal:     Tenderness: There is no abdominal tenderness.  Musculoskeletal:     Right lower leg: Edema present.     Left lower leg: Edema present.  Skin:    General: Skin is warm.     Capillary Refill: Capillary refill takes less than 2 seconds.  Neurological:     Comments: Awake and will answer questions.  Complete NIH scoring done by neurology.  But grossly grip  strength symmetric without drift on either upper extremity.  What appears to be symmetric weakness in both lower extremities.      ED Treatments / Results  Labs (all labs ordered are listed, but only abnormal results are displayed) Labs Reviewed  APTT - Abnormal; Notable for the following components:      Result Value   aPTT 22 (*)    All other components within normal limits  COMPREHENSIVE METABOLIC PANEL - Abnormal; Notable for the following components:   Glucose, Bld 118 (*)    Albumin 3.2 (*)    AST 53 (*)    GFR calc non Af Amer 57 (*)    All other components within normal limits  I-STAT CHEM 8, ED - Abnormal; Notable for the following components:   Glucose, Bld 112 (*)    All other components within normal limits  CBG MONITORING, ED - Abnormal; Notable for the following components:   Glucose-Capillary 107 (*)    All other components within normal limits  CBG MONITORING, ED - Abnormal; Notable for the following components:   Glucose-Capillary 100 (*)    All other components within normal limits  SARS CORONAVIRUS 2 (HOSPITAL ORDER, [facility])  PROTIME-INR  CBC  DIFFERENTIAL  URINALYSIS, ROUTINE W REFLEX MICROSCOPIC    EKG EKG Interpretation  Date/Time:  Tuesday Jul 03 2018 12:43:02 EDT Ventricular Rate:  75 PR Interval:    QRS Duration: 111 QT Interval:  412 QTC Calculation: 461 R Axis:   -81 Text Interpretation:  Sinus rhythm Inferior infarct, old Confirmed by Davonna Belling [phone]) on 07/03/2018 12:50:02 PM   Radiology Dg Chest Portable 1 View  Result Date: 07/03/2018 CLINICAL DATA:  Weakness EXAM: PORTABLE CHEST 1 VIEW COMPARISON:  06/01/2017 FINDINGS: Cardiac shadows within normal limits. Mitral valve calcifications are noted. The lungs are clear bilaterally. No acute bony abnormality is seen. IMPRESSION: No acute abnormality noted. Electronically Signed   By: Inez Catalina M.D.   On: 07/03/2018 13:01   Ct Head Code Stroke Wo  Contrast  Result Date: 07/03/2018 CLINICAL DATA:  Code stroke.  Left-sided weakness EXAM: CT HEAD WITHOUT CONTRAST TECHNIQUE: Contiguous axial images were obtained from the base of the skull through the vertex without intravenous contrast. COMPARISON:  MRI head 06/01/2017, CT 06/01/2017 FINDINGS: Brain: Moderate atrophy. Moderate chronic microvascular ischemic change in the white matter. Hypodensity right cerebellum consistent with chronic subarachnoid space. Negative for acute infarct, hemorrhage, or mass. Vascular: Negative for hyperdense vessel. Atherosclerotic calcification Skull: Negative Sinuses/Orbits: Mild mucosal edema paranasal sinuses. Right cataract surgery. Other: None ASPECTS ([place] Stroke Program Early CT Score) - Ganglionic level infarction (caudate, lentiform nuclei, internal capsule, insula,  M1-M3 cortex): 7 - Supraganglionic infarction (M4-M6 cortex): 3 Total score (0-10 with 10 being normal): 10 IMPRESSION: 1. No acute abnormality 2. ASPECTS is 10 3. Moderate atrophy and moderate chronic microvascular ischemic change 4. These results were called by telephone at the time of interpretation on 07/03/2018 at 12:40 pm to Dr. Leonel Ramsay , who verbally acknowledged these results. Electronically Signed   By: Franchot Gallo M.D.   On: 07/03/2018 12:41    Procedures Procedures (including critical care time)  Medications Ordered in ED Medications  sodium chloride flush (NS) 0.9 % injection 3 mL (3 mLs Intravenous Not Given 07/03/18 1247)     Initial Impression / Assessment and Plan / ED Course  I have reviewed the triage vital signs and the nursing notes.  Pertinent labs & imaging results that were available during my care of the patient were reviewed by me and considered in my medical decision making (see chart for details).        Patient presents with acute facial droop and potentially her right-sided weakness.  Now isolated mostly to facial droop.  85 years old.  Reportedly  relatively high level of care at baseline.  Met as a code stroke by myself and neurology.  Not a TPA candidate due to time of onset and relatively minor deficits.  However Dr. Chief Strategy Officer feels of patient benefit from [place] the hospital for further work-up and other risk reduction.  Will admit to hospitalist  Final Clinical Impressions(s) / ED Diagnoses   Final diagnoses:  Cerebrovascular accident (CVA), unspecified mechanism [facility])    ED Discharge Orders    None       Davonna Belling, MD 07/03/18 1416

## 2018-07-03 NOTE — H&P (Addendum)
History and Physical    MACKLYNN ABDELMALEK AST:[id number] DOB: 08-11-1921 DOA: 07/03/2018  PCP: Tally Joe, MD  Patient coming from: [facility]   Chief Complaint: Right facial droop   HPI: ASCHLEY Mckee is a 85 y.o. female with medical history significant of hypertension, hyperlipidemia, previous stroke who presents with a right facial droop.  She was last seen normal at the nursing home at 8:30 in the morning.  Approximately of 11 AM, physical therapy came in to work with her and noticed that patient had a right facial droop and was more confused.  EMS was called and patient was brought in as a code stroke.  She denies any complaints on my examination.  She denies any fevers, chills, chest pain, cough, shortness of breath, nausea, vomiting, diarrhea or abdominal pain.  ED Course: Neurology evaluated patient in the emergency department.  CT head without acute abnormality.  Neurology determined that patient's symptoms were too mild to treat with TPA.  They recommended further stroke work-up including MRI, MRA, echocardiogram.  Review of Systems: As per HPI otherwise 10 point review of systems negative.   Past Medical History:  Diagnosis Date  . Hyperlipidemia   . Hypertension   . Hypokalemia   . Stroke [facility])     Past Surgical History:  Procedure Laterality Date  . ABDOMINAL HYSTERECTOMY       reports that she has never smoked. She has never used smokeless tobacco. She reports that she does not drink alcohol or use drugs.  No Known Allergies  Family History  Problem Relation Age of Onset  . Heart attack Father     Prior to Admission medications   Medication Sig Start Date End Date Taking? Authorizing Provider  acetaminophen (TYLENOL) 325 MG tablet Take 650 mg by mouth every 6 (six) hours as needed for mild pain or headache.    [provider]  atorvastatin (LIPITOR) 80 MG tablet Take 1 tablet (80 mg total) by mouth daily at 6 PM. Patient taking differently: Take 80 mg by  mouth daily at 8 pm.  06/03/17   Danford, Earl Lites, MD  cephALEXin (KEFLEX) 500 MG capsule Take 1 capsule (500 mg total) by mouth 4 (four) times daily. 04/30/18   Mesner, Barbara Cower, MD  clopidogrel (PLAVIX) 75 MG tablet Take 1 tablet (75 mg total) by mouth daily. 06/04/17   Danford, Earl Lites, MD  hydrochlorothiazide (HYDRODIURIL) 25 MG tablet Take 25 mg by mouth daily.    [provider]  mirtazapine (REMERON) 15 MG tablet Take 15 mg by mouth at bedtime.    [provider]  potassium chloride (K-DUR) 10 MEQ tablet Take 1 tablet (10 mEq total) by mouth 2 (two) times daily. 08/01/13   Vanetta Mulders, MD    Physical Exam: Vitals:   07/03/18 1245 07/03/18 1300 07/03/18 1315 07/03/18 1330  BP: (!) 157/77 140/70 (!) 143/64 (!) 147/67  Pulse: 73 72 76 73  Resp: 18 17 16 14   Temp:      TempSrc:      SpO2: 100% 99% 98% 98%  Weight:         Constitutional: NAD, calm, comfortable Eyes: PERRL, lids and conjunctivae normal ENMT: Mucous membranes are dry. Posterior pharynx clear of any exudate or lesions.Normal dentition.  Neck: normal, supple, no masses, no thyromegaly Respiratory: clear to auscultation bilaterally, no wheezing, no crackles. Normal respiratory effort. No accessory muscle use.  Cardiovascular: Regular rate and rhythm, no murmurs / rubs / gallops. No extremity edema.  Abdomen: no tenderness, no masses palpated. No hepatosplenomegaly. Bowel sounds positive.  Musculoskeletal: no clubbing / cyanosis. No joint deformity upper and lower extremities. Good ROM, no contractures. Normal muscle tone.  Skin: no rashes, lesions, ulcers on exposed skin Neurologic: CN 2-12 grossly intact. Strength 5/5 in all 4.  Speech clear Psychiatric: Normal judgment and insight. Alert and oriented x 3. Normal mood.   Labs on Admission: I have personally reviewed following labs and imaging studies  CBC: Recent Labs  Lab 07/03/18 1221 07/03/18 1241  WBC 8.5  --   NEUTROABS 5.4  --    HGB 12.9 12.6  HCT 40.6 37.0  MCV 93.1  --   PLT 254  --    Basic Metabolic Panel: Recent Labs  Lab 07/03/18 1221 07/03/18 1241  NA 143 141  K 3.7 3.7  CL 106 104  CO2 29  --   GLUCOSE 118* 112*  BUN 15 19  CREATININE 0.86 0.70  CALCIUM 9.8  --    GFR: CrCl cannot be calculated (Unknown ideal weight.). Liver Function Tests: Recent Labs  Lab 07/03/18 1221  AST 53*  ALT 32  ALKPHOS 106  BILITOT 0.7  PROT 7.9  ALBUMIN 3.2*   No results for input(s): LIPASE, AMYLASE in the last 168 hours. No results for input(s): AMMONIA in the last 168 hours. Coagulation Profile: Recent Labs  Lab 07/03/18 1221  INR 1.0   Cardiac Enzymes: No results for input(s): CKTOTAL, CKMB, CKMBINDEX, TROPONINI in the last 168 hours. BNP (last 3 results) No results for input(s): PROBNP in the last 8760 hours. HbA1C: No results for input(s): HGBA1C in the last 72 hours. CBG: Recent Labs  Lab 07/03/18 1221 07/03/18 1223  GLUCAP 107* 100*   Lipid Profile: No results for input(s): CHOL, HDL, LDLCALC, TRIG, CHOLHDL, LDLDIRECT in the last 72 hours. Thyroid Function Tests: No results for input(s): TSH, T4TOTAL, FREET4, T3FREE, THYROIDAB in the last 72 hours. Anemia Panel: No results for input(s): VITAMINB12, FOLATE, FERRITIN, TIBC, IRON, RETICCTPCT in the last 72 hours. Urine analysis:    Component Value Date/Time   COLORURINE AMBER (A) 04/30/2018 2018   APPEARANCEUR CLOUDY (A) 04/30/2018 2018   LABSPEC 1.018 04/30/2018 2018   PHURINE 7.0 04/30/2018 2018   GLUCOSEU NEGATIVE 04/30/2018 2018   HGBUR SMALL (A) 04/30/2018 2018   BILIRUBINUR NEGATIVE 04/30/2018 2018   KETONESUR NEGATIVE 04/30/2018 2018   PROTEINUR NEGATIVE 04/30/2018 2018   UROBILINOGEN 1.0 12/23/2013 1438   NITRITE NEGATIVE 04/30/2018 2018   LEUKOCYTESUR NEGATIVE 04/30/2018 2018   Sepsis Labs: !!!!!!!!!!!!!!!!!!!!!!!!!!!!!!!!!!!!!!!!!!!! @LABRCNTIP (procalcitonin:4,lacticidven:4) ) Recent Results (from the past 240  hour(s))  SARS Coronavirus 2 (CEPHEID - Performed in [facility] lab), Hosp Order     Status: None   Collection Time: 07/03/18 12:49 PM  Result Value Ref Range Status   SARS Coronavirus 2 NEGATIVE NEGATIVE Final    Comment: (NOTE) If result is NEGATIVE SARS-CoV-2 target nucleic acids are NOT DETECTED. The SARS-CoV-2 RNA is generally detectable in upper and lower  respiratory specimens during the acute phase of infection. The lowest  concentration of SARS-CoV-2 viral copies this assay can detect is 250  copies / mL. A negative result does not preclude SARS-CoV-2 infection  and should not be used as the sole basis for treatment or other  patient management decisions.  A negative result may occur with  improper specimen collection / handling, submission of specimen other  than nasopharyngeal swab, presence of viral mutation(s) within the  areas targeted by this  assay, and inadequate number of viral copies  (<250 copies / mL). A negative result must be combined with clinical  observations, patient history, and epidemiological information. If result is POSITIVE SARS-CoV-2 target nucleic acids are DETECTED. The SARS-CoV-2 RNA is generally detectable in upper and lower  respiratory specimens dur ing the acute phase of infection.  Positive  results are indicative of active infection with SARS-CoV-2.  Clinical  correlation with patient history and other diagnostic information is  necessary to determine patient infection status.  Positive results do  not rule out bacterial infection or co-infection with other viruses. If result is PRESUMPTIVE POSTIVE SARS-CoV-2 nucleic acids MAY BE PRESENT.   A presumptive positive result was obtained on the submitted specimen  and confirmed on repeat testing.  While 2019 novel coronavirus  (SARS-CoV-2) nucleic acids may be present in the submitted sample  additional confirmatory testing may be necessary for epidemiological  and / or clinical  management purposes  to differentiate between  SARS-CoV-2 and other Sarbecovirus currently known to infect humans.  If clinically indicated additional testing with an alternate test  methodology [phone]) is advised. The SARS-CoV-2 RNA is generally  detectable in upper and lower respiratory sp ecimens during the acute  phase of infection. The expected result is Negative. Fact Sheet for Patients:  BoilerBrush.com.cy Fact Sheet for Healthcare Providers: https://pope.com/ This test is not yet approved or cleared by the [place] FDA and has been authorized for detection and/or diagnosis of SARS-CoV-2 by FDA under an Emergency Use Authorization (EUA).  This EUA will remain in effect (meaning this test can be used) for the duration of the COVID-19 declaration under Section 564(b)(1) of the Act, 21 U.S.C. section 360bbb-3(b)(1), unless the authorization is terminated or revoked sooner. Performed at [facility], [address] [place], [place] [address]      Radiological Exams on Admission: Dg Chest Portable 1 View  Result Date: 07/03/2018 CLINICAL DATA:  Weakness EXAM: PORTABLE CHEST 1 VIEW COMPARISON:  06/01/2017 FINDINGS: Cardiac shadows within normal limits. Mitral valve calcifications are noted. The lungs are clear bilaterally. No acute bony abnormality is seen. IMPRESSION: No acute abnormality noted. Electronically Signed   By: Alcide Clever M.D.   On: 07/03/2018 13:01   Ct Head Code Stroke Wo Contrast  Result Date: 07/03/2018 CLINICAL DATA:  Code stroke.  Left-sided weakness EXAM: CT HEAD WITHOUT CONTRAST TECHNIQUE: Contiguous axial images were obtained from the base of the skull through the vertex without intravenous contrast. COMPARISON:  MRI head 06/01/2017, CT 06/01/2017 FINDINGS: Brain: Moderate atrophy. Moderate chronic microvascular ischemic change in the white matter. Hypodensity right cerebellum consistent with chronic  subarachnoid space. Negative for acute infarct, hemorrhage, or mass. Vascular: Negative for hyperdense vessel. Atherosclerotic calcification Skull: Negative Sinuses/Orbits: Mild mucosal edema paranasal sinuses. Right cataract surgery. Other: None ASPECTS ([place] Stroke Program Early CT Score) - Ganglionic level infarction (caudate, lentiform nuclei, internal capsule, insula, M1-M3 cortex): 7 - Supraganglionic infarction (M4-M6 cortex): 3 Total score (0-10 with 10 being normal): 10 IMPRESSION: 1. No acute abnormality 2. ASPECTS is 10 3. Moderate atrophy and moderate chronic microvascular ischemic change 4. These results were called by telephone at the time of interpretation on 07/03/2018 at 12:40 pm to Dr. Amada Jupiter , who verbally acknowledged these results. Electronically Signed   By: Marlan Palau M.D.   On: 07/03/2018 12:41    EKG: Independently reviewed.  Normal sinus rhythm without acute ST abnormality  Assessment/Plan Principal Problem:   TIA (transient ischemic attack) Active  Problems:   Essential hypertension   Right facial droop -CT head without acute abnormality  -MRI brain, MRA head and neck -Neurology consulted -Echocardiogram -Plavix -Lipitor -Check hemoglobin A1c, lipid profile -Neurochecks -PT OT SLP  Hypertension -Hold hydrochlorothiazide to allow permissive hypertension   DVT prophylaxis: Lovenox Code Status: DNR, paperwork on file from ALF  Family Communication: Unable to get a hold of family member at time of admission Disposition Plan: Pending further stroke work up  [place] called: Neurology   Admission status: Observation  Severity of Illness: The appropriate patient status for this patient is OBSERVATION. Observation status is judged to be reasonable and necessary in order to provide the required intensity of service to ensure the patient's safety. The patient's presenting symptoms, physical exam findings, and initial radiographic and laboratory data in  the context of their medical condition is felt to place them at decreased risk for further clinical deterioration. Furthermore, it is anticipated that the patient will be medically stable for discharge from the hospital within 2 midnights of admission. The following factors support the patient status of observation.   " The patient's presenting symptoms include right lower facial droop. " The physical exam findings include resolved right facial droop without any acute focal neurologic change. " The initial radiographic and laboratory data are unremarkable, CT head negative.    Noralee Stain, DO [facility] 07/03/2018, 2:50 PM    How to contact the [facility] Attending or Consulting provider 7A - 7P or covering provider during after hours 7P -7A, for this patient?  1. Check the care team in [facility] and look for a) attending/consulting TR[facility] provider listed and b) the [facility] team listed 2. Log into [email or web address] and use [facility]'s universal password to access. If you do not have the password, please contact the hospital operator. 3. Locate the [facility] provider you are looking for under [facility] and page to a number that you can be directly reached. 4. If you still have difficulty reaching the provider, please page the [facility] (Director on Call) for the Hospitalists listed on amion for assistance.

## 2018-07-04 ENCOUNTER — Ambulatory Visit (HOSPITAL_BASED_OUTPATIENT_CLINIC_OR_DEPARTMENT_OTHER): Payer: Medicare Other

## 2018-07-04 DIAGNOSIS — Z90710 Acquired absence of both cervix and uterus: Secondary | ICD-10-CM | POA: Diagnosis not present

## 2018-07-04 DIAGNOSIS — Z1159 Encounter for screening for other viral diseases: Secondary | ICD-10-CM | POA: Diagnosis not present

## 2018-07-04 DIAGNOSIS — E785 Hyperlipidemia, unspecified: Secondary | ICD-10-CM | POA: Diagnosis present

## 2018-07-04 DIAGNOSIS — G459 Transient cerebral ischemic attack, unspecified: Secondary | ICD-10-CM

## 2018-07-04 DIAGNOSIS — I10 Essential (primary) hypertension: Secondary | ICD-10-CM | POA: Diagnosis present

## 2018-07-04 DIAGNOSIS — E876 Hypokalemia: Secondary | ICD-10-CM | POA: Diagnosis present

## 2018-07-04 DIAGNOSIS — Z8249 Family history of ischemic heart disease and other diseases of the circulatory system: Secondary | ICD-10-CM | POA: Diagnosis not present

## 2018-07-04 DIAGNOSIS — G8194 Hemiplegia, unspecified affecting left nondominant side: Secondary | ICD-10-CM | POA: Diagnosis present

## 2018-07-04 DIAGNOSIS — Z79899 Other long term (current) drug therapy: Secondary | ICD-10-CM | POA: Diagnosis not present

## 2018-07-04 DIAGNOSIS — R29703 NIHSS score 3: Secondary | ICD-10-CM | POA: Diagnosis present

## 2018-07-04 DIAGNOSIS — Z8673 Personal history of transient ischemic attack (TIA), and cerebral infarction without residual deficits: Secondary | ICD-10-CM | POA: Diagnosis not present

## 2018-07-04 DIAGNOSIS — R29810 Facial weakness: Secondary | ICD-10-CM | POA: Diagnosis present

## 2018-07-04 DIAGNOSIS — Z66 Do not resuscitate: Secondary | ICD-10-CM | POA: Diagnosis present

## 2018-07-04 DIAGNOSIS — R7303 Prediabetes: Secondary | ICD-10-CM | POA: Diagnosis present

## 2018-07-04 DIAGNOSIS — Z7902 Long term (current) use of antithrombotics/antiplatelets: Secondary | ICD-10-CM | POA: Diagnosis not present

## 2018-07-04 LAB — CBC
HCT: 39 % (ref 36.0–46.0)
Hemoglobin: 12.8 g/dL (ref 12.0–15.0)
MCH: 29.8 pg (ref 26.0–34.0)
MCHC: 32.8 g/dL (ref 30.0–36.0)
MCV: 90.7 fL (ref 80.0–100.0)
Platelets: 228 10*3/uL (ref 150–400)
RBC: 4.3 MIL/uL (ref 3.87–5.11)
RDW: 13.7 % (ref 11.5–15.5)
WBC: 7.8 10*3/uL (ref 4.0–10.5)
nRBC: 0 % (ref 0.0–0.2)

## 2018-07-04 LAB — BASIC METABOLIC PANEL
Anion gap: 12 (ref 5–15)
BUN: 14 mg/dL (ref 8–23)
CO2: 26 mmol/L (ref 22–32)
Calcium: 9.8 mg/dL (ref 8.9–10.3)
Chloride: 102 mmol/L (ref 98–111)
Creatinine, Ser: 0.88 mg/dL (ref 0.44–1.00)
GFR calc Af Amer: >60 mL/min (ref >60)
GFR calc non Af Amer: 56 mL/min — ABNORMAL LOW (ref >60)
Glucose, Bld: 95 mg/dL (ref 70–99)
Potassium: 3.7 mmol/L (ref 3.5–5.1)
Sodium: 140 mmol/L (ref 135–145)

## 2018-07-04 LAB — LIPID PANEL
Cholesterol: 152 mg/dL (ref 0–200)
HDL: 52 mg/dL (ref >40)
LDL Cholesterol: 79 mg/dL (ref 0–99)
Total CHOL/HDL Ratio: 2.9 RATIO
Triglycerides: 103 mg/dL (ref <150)
VLDL: 21 mg/dL (ref 0–40)

## 2018-07-04 LAB — HEMOGLOBIN A1C
Hgb A1c MFr Bld: 6.4 % — ABNORMAL HIGH (ref 4.8–5.6)
Mean Plasma Glucose: 136.98 mg/dL

## 2018-07-04 LAB — ECHOCARDIOGRAM COMPLETE: Weight: 2088.2 oz

## 2018-07-04 MED ORDER — ASPIRIN EC 81 MG PO TBEC
81.0000 mg | DELAYED_RELEASE_TABLET | Freq: Every day | ORAL | Status: DC
  Administered 2018-07-04 – 2018-07-09 (×4): 81 mg via ORAL
  Filled 2018-07-04 (×5): qty 1

## 2018-07-04 MED ORDER — ENSURE ENLIVE PO LIQD
237.0000 mL | Freq: Two times a day (BID) | ORAL | Status: DC
  Administered 2018-07-05 – 2018-07-09 (×7): 237 mL via ORAL

## 2018-07-04 NOTE — Evaluation (Signed)
Occupational Therapy Evaluation Patient Details Name: Alexandra CoeLois W Denn MRN: [id number] DOB: [birth year] Today's Date: 07/04/2018    History of Present Illness Pt is a 85 y/o female presenting with R facial droop, confusion from SNF. CT negative.  Further workup.  PMH: HTN, CVA.    Clinical Impression   Patient admitted for above and limited by problem list below, including impaired balance, R sided weakness, impaired cognition and decreased activity tolerance.  She presents from SNF and per chart review used RW and needed assistance for ADLs, pt unable to reports ADL assist level but did confirm using RW.  She requires mod assist for bed mobility, min-mod assist for UB ADLs, total assist for LB ADLs and toileting, sit to stand with mod assist +2 using RW.  She will benefit from continued OT services while admitted and after dc at SNF level in order to decrease burden of care with ADLs/mobility. Will follow.     Follow Up Recommendations  SNF;Supervision/Assistance - 24 hour    Equipment Recommendations  None recommended by OT    Recommendations for Other Services PT consult     Precautions / Restrictions Precautions Precautions: Fall Restrictions Weight Bearing Restrictions: No      Mobility Bed Mobility Overal bed mobility: Needs Assistance Bed Mobility: Supine to Sit;Sit to Supine     Supine to sit: Mod assist;HOB elevated Sit to supine: Mod assist;+2 for physical assistance   General bed mobility comments: mod assist for LB mgmt to/from EOB, min guard for trunk support; cueing for technique and sequencing with poor initation of tasks   Transfers Overall transfer level: Needs assistance Equipment used: Rolling walker (2 wheeled) Transfers: Sit to/from Stand Sit to Stand: Mod assist;+2 safety/equipment;+2 physical assistance         General transfer comment: mod assist to power up into standing, cueing for hand placement and safety; poor tolerance    Balance Overall  balance assessment: Needs assistance Sitting-balance support: No upper extremity supported;Feet supported Sitting balance-Leahy Scale: Fair Sitting balance - Comments: min guard to close supervision for safety, able to self correct R lateral lean initally fading to min assist to correct with fatigue Postural control: Right lateral lean Standing balance support: Bilateral upper extremity supported;During functional activity Standing balance-Leahy Scale: Poor Standing balance comment: relaint on BUE and external support                           ADL either performed or assessed with clinical judgement   ADL Overall ADL's : Needs assistance/impaired     Grooming: Minimal assistance;Sitting   Upper Body Bathing: Minimal assistance;Sitting   Lower Body Bathing: +2 for safety/equipment;Sit to/from stand;Maximal assistance;+2 for physical assistance   Upper Body Dressing : Moderate assistance;Sitting   Lower Body Dressing: Total assistance;+2 for safety/equipment;+2 for physical assistance;Sit to/from Market researcherstand     Toilet Transfer Details (indicate cue type and reason): deferred due to fatigue/safety Toileting- Clothing Manipulation and Hygiene: Total assistance;+2 for physical assistance;+2 for safety/equipment;Sit to/from stand       Functional mobility during ADLs: Moderate assistance;+2 for physical assistance;+2 for safety/equipment;Rolling walker;Cueing for safety;Cueing for sequencing General ADL Comments: pt limited by fatigue, generalized weakness, cognition and R sided weakness/impaired balance     Vision Patient Visual Report: No change from baseline Additional [facility]      Perception     Praxis      Pertinent Vitals/Pain Pain Assessment: No/denies pain     Hand  Dominance Right   Extremity/Trunk Assessment Upper Extremity Assessment Upper Extremity Assessment: RUE deficits/detail;Generalized weakness RUE Deficits / Details: grossly 3-/5 MMT  (compared to 4/5 L UE) RUE Sensation: WNL RUE Coordination: WNL   Lower Extremity Assessment Lower Extremity Assessment: Defer to PT evaluation       Communication Communication Communication: No difficulties   Cognition Arousal/Alertness: Lethargic Behavior During Therapy: Flat affect Overall Cognitive Status: No family/caregiver present to determine baseline cognitive functioning Area of Impairment: Orientation;Attention;Memory;Following commands;Safety/judgement;Awareness;Problem solving                 Orientation Level: Disoriented to;Place;Situation Current Attention Level: Focused Memory: Decreased recall of precautions;Decreased short-term memory Following Commands: Follows one step commands with increased time;Follows one step commands consistently Safety/Judgement: Decreased awareness of safety;Decreased awareness of deficits Awareness: Intellectual Problem Solving: Difficulty sequencing;Requires tactile cues;Slow processing;Decreased initiation;Requires verbal cues General Comments: pt oriented to self and time, she is able to folllow 1 step commands given increased time but requires constant redirection to task; she is incontinent of bowel upon entry with no awareness    General Comments       Exercises     Shoulder Instructions      Home Living Family/patient expects to be discharged to:: Skilled nursing facility                                 Additional Comments: pt presents from SNF, she is unable to report further information      Prior Functioning/Environment Level of Independence: Needs assistance  Gait / Transfers Assistance Needed: reports using RW  ADL's / Homemaking Assistance Needed: per chart review, needs assistance            OT Problem List: Decreased strength;Decreased activity tolerance;Impaired balance (sitting and/or standing);Decreased cognition;Decreased safety awareness;Decreased knowledge of use of DME or  AE;Decreased knowledge of precautions      OT Treatment/Interventions: Self-care/ADL training;Neuromuscular education;DME and/or AE instruction;Therapeutic activities;Cognitive remediation/compensation;Patient/family education;Balance training    OT Goals(Current goals can be found in the care plan section) Acute Rehab OT Goals Patient Stated Goal: to lay back down  OT Goal Formulation: With patient Time For Goal Achievement: 07/18/18 Potential to Achieve Goals: Good  OT Frequency: Min 2X/week   Barriers to D/C:            Co-evaluation PT/OT/SLP Co-Evaluation/Treatment: Yes Reason for Co-Treatment: Necessary to address cognition/behavior during functional activity;For patient/therapist safety;To address functional/ADL transfers   OT goals addressed during session: ADL's and self-care      AM-PAC OT "6 Clicks" Daily Activity     Outcome Measure Help from another person eating meals?: A Little Help from another person taking care of personal grooming?: A Little Help from another person toileting, which includes using toliet, bedpan, or urinal?: Total Help from another person bathing (including washing, rinsing, drying)?: A Lot Help from another person to put on and taking off regular upper body clothing?: A Lot Help from another person to put on and taking off regular lower body clothing?: Total 6 Click Score: 12   End of Session Equipment Utilized During Treatment: Gait belt;Rolling walker Nurse Communication: Mobility status;Other (comment)(need for purewick)  Activity Tolerance: Patient limited by fatigue Patient left: in bed;with call bell/phone within reach;with bed alarm set  OT Visit Diagnosis: Other abnormalities of gait and mobility (R26.89);Muscle weakness (generalized) (M62.81);Other symptoms and signs involving the nervous system (R29.898)  Time: 2035-5974 OT Time Calculation (min): 20 min Charges:  OT General Charges $OT Visit: 1 Visit OT  Evaluation $OT Eval Moderate Complexity: 1 Mod  Chancy Milroy, OT Acute Rehabilitation Services Pager [phone] Office [phone]   Chancy Milroy 07/04/2018, 1:31 PM

## 2018-07-04 NOTE — Care Management Obs Status (Signed)
MEDICARE OBSERVATION STATUS NOTIFICATION   Patient Details  Name: Alexandra Mckee MRN: [id number] Date of Birth: [birth year]   Medicare Observation Status Notification Given:  Yes    Kermit Balo, RN 07/04/2018, 3:08 PM

## 2018-07-04 NOTE — Evaluation (Signed)
Physical Therapy Evaluation Patient Details Name: Alexandra Mckee MRN: [id number] DOB: 1922-02-06 Today's Date: 07/04/2018   History of Present Illness  Pt is a 85 y/o female presenting with R facial droop, confusion from SNF. CT negative.  Further workup.  PMH: HTN, CVA. MRI scan shows no acute infarct.  MRA shows 50% proximal right ICA stenosis and severe intracranial right M2 and right P2 stenosis.  Clinical Impression  Alexandra Mckee was able to stand EOB with RW and two person assist long enough to preform peri care.  She requested to get back in the bed after the effort.  She would benefit from acute therapy to ensure she returns to her PLOF.   PT to follow acutely for deficits listed below.      Follow Up Recommendations SNF    Equipment Recommendations  None recommended by PT    Recommendations for Other Services   NA    Precautions / Restrictions Precautions Precautions: Fall Restrictions Weight Bearing Restrictions: No      Mobility  Bed Mobility Overal bed mobility: Needs Assistance Bed Mobility: Supine to Sit;Sit to Supine     Supine to sit: Mod assist;HOB elevated Sit to supine: Mod assist;+2 for physical assistance   General bed mobility comments: mod assist for LB mgmt to/from EOB, min guard for trunk support; cueing for technique and sequencing with poor initation of tasks Mod assist to lift legs back into bed while gravity assisted trunk.   Transfers Overall transfer level: Needs assistance Equipment used: Rolling walker (2 wheeled) Transfers: Sit to/from Stand Sit to Stand: Mod assist;+2 safety/equipment;+2 physical assistance         General transfer comment: mod assist to power up into standing, cueing for hand placement and safety; poor tolerance  Ambulation/Gait             General Gait Details: unable to ambulate, only able to stand ~1 min for peri care      Balance Overall balance assessment: Needs assistance Sitting-balance support: No upper  extremity supported;Feet supported Sitting balance-Leahy Scale: Fair Sitting balance - Comments: min guard to close supervision for safety, able to self correct R lateral lean initally fading to min assist to correct with fatigue Postural control: Right lateral lean Standing balance support: Bilateral upper extremity supported;During functional activity Standing balance-Leahy Scale: Poor Standing balance comment: relaint on BUE and external support, stood ~1 min supported requesting to sit almost immediately for total assist pericare due to incontinence of bowel and bladder                             Pertinent Vitals/Pain Pain Assessment: No/denies pain    Home Living Family/patient expects to be discharged to:: Skilled nursing facility                 Additional Comments: pt presents from SNF, she is unable to report further information    Prior Function Level of Independence: Needs assistance   Gait / Transfers Assistance Needed: reports using RW   ADL's / Homemaking Assistance Needed: per chart review, needs assistance        Hand Dominance   Dominant Hand: Right    Extremity/Trunk Assessment   Upper Extremity Assessment Upper Extremity Assessment: Defer to OT evaluation    Lower Extremity Assessment Lower Extremity Assessment: Generalized weakness(no obvious asymmetries per seated gross assessment)    Cervical / Trunk Assessment Cervical / Trunk Assessment: Normal  Communication  Communication: No difficulties  Cognition Arousal/Alertness: Lethargic Behavior During Therapy: Flat affect Overall Cognitive Status: No family/caregiver present to determine baseline cognitive functioning Area of Impairment: Orientation;Attention;Memory;Following commands;Safety/judgement;Awareness;Problem solving                 Orientation Level: Disoriented to;Place;Situation Current Attention Level: Focused Memory: Decreased recall of  precautions;Decreased short-term memory Following Commands: Follows one step commands with increased time;Follows one step commands consistently Safety/Judgement: Decreased awareness of safety;Decreased awareness of deficits Awareness: Intellectual Problem Solving: Difficulty sequencing;Requires tactile cues;Slow processing;Decreased initiation;Requires verbal cues General Comments: pt oriented to self and time, she is able to folllow 1 step commands given increased time but requires constant redirection to task; she is incontinent of bowel upon entry with no awareness. She is unable to report why she wants to return to bed (tired, painful, lightheaded all given with no response).             Assessment/Plan    PT Assessment Patient needs continued PT services  PT Problem List Decreased strength;Decreased activity tolerance;Decreased balance;Decreased mobility;Decreased cognition;Decreased knowledge of use of DME;Decreased knowledge of precautions       PT Treatment Interventions DME instruction;Gait training;Stair training;Functional mobility training;Therapeutic activities;Therapeutic exercise;Neuromuscular re-education;Balance training;Cognitive remediation;Patient/family education    PT Goals (Current goals can be found in the Care Plan section)  Acute Rehab PT Goals Patient Stated Goal: to lay back down  PT Goal Formulation: Patient unable to participate in goal setting Time For Goal Achievement: 07/18/18 Potential to Achieve Goals: Good    Frequency Min 3X/week           AM-PAC PT "6 Clicks" Mobility  Outcome Measure Help needed turning from your back to your side while in a flat bed without using bedrails?: A Lot Help needed moving from lying on your back to sitting on the side of a flat bed without using bedrails?: A Lot Help needed moving to and from a bed to a chair (including a wheelchair)?: A Lot Help needed standing up from a chair using your arms (e.g., wheelchair  or bedside chair)?: A Lot Help needed to walk in hospital room?: Total Help needed climbing 3-5 steps with a railing? : Total 6 Click Score: 10    End of Session Equipment Utilized During Treatment: Gait belt Activity Tolerance: Patient limited by fatigue Patient left: in bed;with call bell/phone within reach;with bed alarm set Nurse Communication: Mobility status;Other (comment)(needs a purwick) PT Visit Diagnosis: Muscle weakness (generalized) (M62.81);Difficulty in walking, not elsewhere classified (R26.2);Other symptoms and signs involving the nervous system (O03.559)    Time: 7416-3845 PT Time Calculation (min) (ACUTE ONLY): 25 min   Charges:          Lurena Joiner B. Khalifa Knecht, PT, DPT  Acute Rehabilitation #[phone] pager #[phone] office   PT Evaluation $PT Eval Moderate Complexity: 1 Mod          07/04/2018, 5:52 PM

## 2018-07-04 NOTE — TOC Progression Note (Signed)
Transition of Care [facility]) - Progression Note    Patient Details  Name: Alexandra Mckee MRN: [id number] Date of Birth: [birth year]  [facility] CM/SW Contact  Baldemar Lenis, [place] Phone Number: 07/04/2018, 4:31 PM  Clinical Narrative:  CSW spoke with RN Alma at [facility] to obtain information about patient's baseline status and care available for patient upon return. Per Aniceto Boss, patient had been independent for a really long time, but has been requiring more assist since February or March of this year. She requires assistance with mobility, all ADLs, and cueing and assist during meals, as well. Alma reported that they are able to provide up to total care, they are able to take the patient back regardless of her ability level. Alma said, "She's our baby, we want her back no matter what, we will take care of whatever she needs, you just let us know what she needs." She has been previously set up with home health therapy services through [facility], will need resumption orders. CSW spoke with patient's son, Leonette Most, and confirmed plan to return to ALF when stable. Charles requesting a call from MD to discuss test results.     Expected Discharge Plan: Assisted Living Barriers to Discharge: Continued Medical Work up  Expected Discharge Plan and Services Expected Discharge Plan: Assisted Living In-house Referral: Clinical Social Work     Living arrangements for the past 2 months: Assisted Living Facility([facility])                                       Social Determinants of Health (SDOH) Interventions    Readmission Risk Interventions No flowsheet data found.

## 2018-07-04 NOTE — Progress Notes (Signed)
  Echocardiogram 2D Echocardiogram has been performed.  Alexandra Mckee 07/04/2018, 11:39 AM

## 2018-07-04 NOTE — Progress Notes (Signed)
[name] Kitchen.  PROGRESS NOTE    Alexandra Mckee  QIO:[id number] DOB: 1921/09/25 DOA: 07/03/2018 PCP: Tally JoeSwayne, David, MD   Brief Narrative:   Alexandra Mckee is a 85 y.o. female with medical history significant of hypertension, hyperlipidemia, previous stroke who presents with a right facial droop.  She was last seen normal at the nursing home at 8:30 in the morning.  Approximately of 11 AM, physical therapy came in to work with her and noticed that patient had a right facial droop and was more confused.  EMS was called and patient was brought in as a code stroke.  She denies any complaints on my examination.  She denies any fevers, chills, chest pain, cough, shortness of breath, nausea, vomiting, diarrhea or abdominal pain   Assessment & Plan:   Principal Problem:   TIA (transient ischemic attack) Active Problems:   Essential hypertension   TIA     - MRI Brain: IMPRESSION: 1. No acute intracranial abnormality. 2. Chronic ischemic microangiopathy and generalized volume loss. 3. Unchanged multifocal posterior cerebral artery short segment occlusion and/or severe stenosis. 4. Unchanged right MCA M2 segment and left ACA A2 segment severe stenoses. 5. Approximately 50% stenosis both proximal internal carotid arteries, unchanged.     - she has persistent facial droop and some LUE weakness that is asymmetrical.      - SLP cognition eval pending.      - she is not safe for home discharge at this point  HTN     - allow for permissive HTN for now    DVT prophylaxis: Lovenox Code Status: DNR Family Communication: None   Disposition Plan: TBD   Consultants:   Neurology    Subjective: No acute events ON  Objective: Vitals:   07/04/18 0145 07/04/18 0422 07/04/18 0840 07/04/18 1230  BP: (!) 147/68 132/77 (!) 156/78 (!) 141/66  Pulse: 70 77 78 79  Resp: 16 17 17 17   Temp:  98.5 F (36.9 C) 98.4 F (36.9 C) 98.5 F (36.9 C)  TempSrc:  Oral Axillary Axillary  SpO2: 96% 98% 100% 98%  Weight:       No  intake or output data in the 24 hours ending 07/04/18 1537 Filed Weights   07/03/18 1200  Weight: 59.2 kg    Examination:  General: 85 y.o. female resting in bed in NAD Cardiovascular: RRR, +S1, S2, 2/6 SEM, no g/r, equal pulses throughout Respiratory: CTABL, no w/r/r, normal WOB GI: BS+, NDNT, no masses noted, no organomegaly noted MSK: No e/c/c Skin: No rashes, bruises, ulcerations noted Neuro: follows commands, nods yes/no, still with persistent facial droop, LUE weakness, and BLE  weakness   Data Reviewed: I have personally reviewed following labs and imaging studies.  CBC: Recent Labs  Lab 07/03/18 1221 07/03/18 1241 07/04/18 0356  WBC 8.5  --  7.8  NEUTROABS 5.4  --   --   HGB 12.9 12.6 12.8  HCT 40.6 37.0 39.0  MCV 93.1  --  90.7  PLT 254  --  228   Basic Metabolic Panel: Recent Labs  Lab 07/03/18 1221 07/03/18 1241 07/04/18 0356  NA 143 141 140  K 3.7 3.7 3.7  CL 106 104 102  CO2 29  --  26  GLUCOSE 118* 112* 95  BUN 15 19 14   CREATININE 0.86 0.70 0.88  CALCIUM 9.8  --  9.8   GFR: CrCl cannot be calculated (Unknown ideal weight.). Liver Function Tests: Recent Labs  Lab 07/03/18 1221  AST 53*  ALT 32  ALKPHOS 106  BILITOT 0.7  PROT 7.9  ALBUMIN 3.2*   No results for input(s): LIPASE, AMYLASE in the last 168 hours. No results for input(s): AMMONIA in the last 168 hours. Coagulation Profile: Recent Labs  Lab 07/03/18 1221  INR 1.0   Cardiac Enzymes: No results for input(s): CKTOTAL, CKMB, CKMBINDEX, TROPONINI in the last 168 hours. BNP (last 3 results) No results for input(s): PROBNP in the last 8760 hours. HbA1C: Recent Labs    07/04/18 0356  HGBA1C 6.4*   CBG: Recent Labs  Lab 07/03/18 1221 07/03/18 1223  GLUCAP 107* 100*   Lipid Profile: Recent Labs    07/04/18 0356  CHOL 152  HDL 52  LDLCALC 79  TRIG 103  CHOLHDL 2.9   Thyroid Function Tests: No results for input(s): TSH, T4TOTAL, FREET4, T3FREE, THYROIDAB in the  last 72 hours. Anemia Panel: No results for input(s): VITAMINB12, FOLATE, FERRITIN, TIBC, IRON, RETICCTPCT in the last 72 hours. Sepsis Labs: No results for input(s): PROCALCITON, LATICACIDVEN in the last 168 hours.  Recent Results (from the past 240 hour(s))  SARS Coronavirus 2 (CEPHEID - Performed in [facility] hospital lab), Hosp Order     Status: None   Collection Time: 07/03/18 12:49 PM  Result Value Ref Range Status   SARS Coronavirus 2 NEGATIVE NEGATIVE Final    Comment: (NOTE) If result is NEGATIVE SARS-CoV-2 target nucleic acids are NOT DETECTED. The SARS-CoV-2 RNA is generally detectable in upper and lower  respiratory specimens during the acute phase of infection. The lowest  concentration of SARS-CoV-2 viral copies this assay can detect is 250  copies / mL. A negative result does not preclude SARS-CoV-2 infection  and should not be used as the sole basis for treatment or other  patient management decisions.  A negative result may occur with  improper specimen collection / handling, submission of specimen other  than nasopharyngeal swab, presence of viral mutation(s) within the  areas targeted by this assay, and inadequate number of viral copies  (<250 copies / mL). A negative result must be combined with clinical  observations, patient history, and epidemiological information. If result is POSITIVE SARS-CoV-2 target nucleic acids are DETECTED. The SARS-CoV-2 RNA is generally detectable in upper and lower  respiratory specimens dur ing the acute phase of infection.  Positive  results are indicative of active infection with SARS-CoV-2.  Clinical  correlation with patient history and other diagnostic information is  necessary to determine patient infection status.  Positive results do  not rule out bacterial infection or co-infection with other viruses. If result is PRESUMPTIVE POSTIVE SARS-CoV-2 nucleic acids MAY BE PRESENT.   A presumptive positive result was obtained  on the submitted specimen  and confirmed on repeat testing.  While 2019 novel coronavirus  (SARS-CoV-2) nucleic acids may be present in the submitted sample  additional confirmatory testing may be necessary for epidemiological  and / or clinical management purposes  to differentiate between  SARS-CoV-2 and other Sarbecovirus currently known to infect humans.  If clinically indicated additional testing with an alternate test  methodology [phone]) is advised. The SARS-CoV-2 RNA is generally  detectable in upper and lower respiratory sp ecimens during the acute  phase of infection. The expected result is Negative. Fact Sheet for Patients:  BoilerBrush.com.cy Fact Sheet for Healthcare Providers: https://pope.com/ This test is not yet approved or cleared by the [place] FDA and has been authorized for detection and/or diagnosis of SARS-CoV-2 by FDA under an Emergency  Use Authorization (EUA).  This EUA will remain in effect (meaning this test can be used) for the duration of the COVID-19 declaration under Section 564(b)(1) of the Act, 21 U.S.C. section 360bbb-3(b)(1), unless the authorization is terminated or revoked sooner. Performed at [facility], [address], [place], [place] [address]          Radiology Studies: Mr [name] Neck W Wo Contrast  Result Date: 07/04/2018 CLINICAL DATA:  Right facial droop EXAM: MR HEAD WITHOUT CONTRAST MR CIRCLE OF WILLIS WITHOUT CONTRAST MRA OF THE NECK WITHOUT AND WITH CONTRAST TECHNIQUE: Multiplanar, multiecho pulse sequences of the brain, circle of willis and surrounding structures were obtained without intravenous contrast. Angiographic images of the neck were obtained using MRA technique without and with intravenous contrast. CONTRAST:  6 mL Gadavist COMPARISON:  Head CT 07/03/2018 Brain MRI 06/01/2017 FINDINGS: MRI HEAD FINDINGS BRAIN: The midline structures are normal. There is no acute  infarct, acute hemorrhage or mass. Diffuse confluent hyperintense T2-weighted signal within the periventricular, deep and juxtacortical white matter, most commonly due to chronic ischemic microangiopathy. Generalized atrophy without lobar predilection. Blood-sensitive sequences show no chronic microhemorrhage or superficial siderosis. SKULL AND UPPER CERVICAL SPINE: The visualized skull base, calvarium, upper cervical spine and extracranial soft tissues are normal. SINUSES/ORBITS: No fluid levels or advanced mucosal thickening. No mastoid or middle ear effusion. The orbits are normal. MRA HEAD FINDINGS POSTERIOR CIRCULATION: --Basilar artery: Normal. --Posterior cerebral arteries: Unchanged occlusion near the origin of the right PCA. Multifocal severe stenosis or short segment occlusion of the left PCA, most evident at the proximal P2 segment. --Superior cerebellar arteries: Normal. --Inferior cerebellar arteries: Normal anterior and posterior inferior cerebellar arteries. ANTERIOR CIRCULATION: --Intracranial internal carotid arteries: Normal. --Anterior cerebral arteries: Both A1 segments are present. Patent anterior communicating artery. Short segment severe stenosis or occlusion of the distal left A2 segment, unchanged. --Middle cerebral arteries: Severe stenosis at the origin of the right M2 segment superior division, unchanged. --Posterior communicating arteries: Diminutive or absent bilaterally. MRA NECK FINDINGS Aortic arch: Normal 3 vessel aortic branching pattern. The visualized subclavian arteries are normal. Right carotid system: Approximately 50% stenosis of the proximal right ICA. Left carotid system: Approximately 50% stenosis of the proximal left ICA. Vertebral arteries: Right dominant. Vertebral artery origins are normal. Vertebral arteries are normal in course and caliber to the vertebrobasilar confluence without stenosis or evidence of dissection. IMPRESSION: 1. No acute intracranial abnormality.  2. Chronic ischemic microangiopathy and generalized volume loss. 3. Unchanged multifocal posterior cerebral artery short segment occlusion and/or severe stenosis. 4. Unchanged right MCA M2 segment and left ACA A2 segment severe stenoses. 5. Approximately 50% stenosis both proximal internal carotid arteries, unchanged. Electronically Signed   By: Deatra Robinson M.D.   On: 07/04/2018 00:14   Mr Brain Wo Contrast  Result Date: 07/04/2018 CLINICAL DATA:  Right facial droop EXAM: MR HEAD WITHOUT CONTRAST MR CIRCLE OF WILLIS WITHOUT CONTRAST MRA OF THE NECK WITHOUT AND WITH CONTRAST TECHNIQUE: Multiplanar, multiecho pulse sequences of the brain, circle of willis and surrounding structures were obtained without intravenous contrast. Angiographic images of the neck were obtained using MRA technique without and with intravenous contrast. CONTRAST:  6 mL Gadavist COMPARISON:  Head CT 07/03/2018 Brain MRI 06/01/2017 FINDINGS: MRI HEAD FINDINGS BRAIN: The midline structures are normal. There is no acute infarct, acute hemorrhage or mass. Diffuse confluent hyperintense T2-weighted signal within the periventricular, deep and juxtacortical white matter, most commonly due to chronic ischemic microangiopathy. Generalized atrophy without lobar  predilection. Blood-sensitive sequences show no chronic microhemorrhage or superficial siderosis. SKULL AND UPPER CERVICAL SPINE: The visualized skull base, calvarium, upper cervical spine and extracranial soft tissues are normal. SINUSES/ORBITS: No fluid levels or advanced mucosal thickening. No mastoid or middle ear effusion. The orbits are normal. MRA HEAD FINDINGS POSTERIOR CIRCULATION: --Basilar artery: Normal. --Posterior cerebral arteries: Unchanged occlusion near the origin of the right PCA. Multifocal severe stenosis or short segment occlusion of the left PCA, most evident at the proximal P2 segment. --Superior cerebellar arteries: Normal. --Inferior cerebellar arteries: Normal  anterior and posterior inferior cerebellar arteries. ANTERIOR CIRCULATION: --Intracranial internal carotid arteries: Normal. --Anterior cerebral arteries: Both A1 segments are present. Patent anterior communicating artery. Short segment severe stenosis or occlusion of the distal left A2 segment, unchanged. --Middle cerebral arteries: Severe stenosis at the origin of the right M2 segment superior division, unchanged. --Posterior communicating arteries: Diminutive or absent bilaterally. MRA NECK FINDINGS Aortic arch: Normal 3 vessel aortic branching pattern. The visualized subclavian arteries are normal. Right carotid system: Approximately 50% stenosis of the proximal right ICA. Left carotid system: Approximately 50% stenosis of the proximal left ICA. Vertebral arteries: Right dominant. Vertebral artery origins are normal. Vertebral arteries are normal in course and caliber to the vertebrobasilar confluence without stenosis or evidence of dissection. IMPRESSION: 1. No acute intracranial abnormality. 2. Chronic ischemic microangiopathy and generalized volume loss. 3. Unchanged multifocal posterior cerebral artery short segment occlusion and/or severe stenosis. 4. Unchanged right MCA M2 segment and left ACA A2 segment severe stenoses. 5. Approximately 50% stenosis both proximal internal carotid arteries, unchanged. Electronically Signed   By: Deatra Robinson M.D.   On: 07/04/2018 00:14   Dg Chest Portable 1 View  Result Date: 07/03/2018 CLINICAL DATA:  Weakness EXAM: PORTABLE CHEST 1 VIEW COMPARISON:  06/01/2017 FINDINGS: Cardiac shadows within normal limits. Mitral valve calcifications are noted. The lungs are clear bilaterally. No acute bony abnormality is seen. IMPRESSION: No acute abnormality noted. Electronically Signed   By: Alcide Clever M.D.   On: 07/03/2018 13:01   Mr [name] Head Wo Contrast  Result Date: 07/04/2018 CLINICAL DATA:  Right facial droop EXAM: MR HEAD WITHOUT CONTRAST MR CIRCLE OF WILLIS WITHOUT  CONTRAST MRA OF THE NECK WITHOUT AND WITH CONTRAST TECHNIQUE: Multiplanar, multiecho pulse sequences of the brain, circle of willis and surrounding structures were obtained without intravenous contrast. Angiographic images of the neck were obtained using MRA technique without and with intravenous contrast. CONTRAST:  6 mL Gadavist COMPARISON:  Head CT 07/03/2018 Brain MRI 06/01/2017 FINDINGS: MRI HEAD FINDINGS BRAIN: The midline structures are normal. There is no acute infarct, acute hemorrhage or mass. Diffuse confluent hyperintense T2-weighted signal within the periventricular, deep and juxtacortical white matter, most commonly due to chronic ischemic microangiopathy. Generalized atrophy without lobar predilection. Blood-sensitive sequences show no chronic microhemorrhage or superficial siderosis. SKULL AND UPPER CERVICAL SPINE: The visualized skull base, calvarium, upper cervical spine and extracranial soft tissues are normal. SINUSES/ORBITS: No fluid levels or advanced mucosal thickening. No mastoid or middle ear effusion. The orbits are normal. MRA HEAD FINDINGS POSTERIOR CIRCULATION: --Basilar artery: Normal. --Posterior cerebral arteries: Unchanged occlusion near the origin of the right PCA. Multifocal severe stenosis or short segment occlusion of the left PCA, most evident at the proximal P2 segment. --Superior cerebellar arteries: Normal. --Inferior cerebellar arteries: Normal anterior and posterior inferior cerebellar arteries. ANTERIOR CIRCULATION: --Intracranial internal carotid arteries: Normal. --Anterior cerebral arteries: Both A1 segments are present. Patent anterior communicating artery. Short segment severe stenosis or occlusion of  the distal left A2 segment, unchanged. --Middle cerebral arteries: Severe stenosis at the origin of the right M2 segment superior division, unchanged. --Posterior communicating arteries: Diminutive or absent bilaterally. MRA NECK FINDINGS Aortic arch: Normal 3 vessel  aortic branching pattern. The visualized subclavian arteries are normal. Right carotid system: Approximately 50% stenosis of the proximal right ICA. Left carotid system: Approximately 50% stenosis of the proximal left ICA. Vertebral arteries: Right dominant. Vertebral artery origins are normal. Vertebral arteries are normal in course and caliber to the vertebrobasilar confluence without stenosis or evidence of dissection. IMPRESSION: 1. No acute intracranial abnormality. 2. Chronic ischemic microangiopathy and generalized volume loss. 3. Unchanged multifocal posterior cerebral artery short segment occlusion and/or severe stenosis. 4. Unchanged right MCA M2 segment and left ACA A2 segment severe stenoses. 5. Approximately 50% stenosis both proximal internal carotid arteries, unchanged. Electronically Signed   By: Deatra Robinson M.D.   On: 07/04/2018 00:14   Ct Head Code Stroke Wo Contrast  Result Date: 07/03/2018 CLINICAL DATA:  Code stroke.  Left-sided weakness EXAM: CT HEAD WITHOUT CONTRAST TECHNIQUE: Contiguous axial images were obtained from the base of the skull through the vertex without intravenous contrast. COMPARISON:  MRI head 06/01/2017, CT 06/01/2017 FINDINGS: Brain: Moderate atrophy. Moderate chronic microvascular ischemic change in the white matter. Hypodensity right cerebellum consistent with chronic subarachnoid space. Negative for acute infarct, hemorrhage, or mass. Vascular: Negative for hyperdense vessel. Atherosclerotic calcification Skull: Negative Sinuses/Orbits: Mild mucosal edema paranasal sinuses. Right cataract surgery. Other: None ASPECTS ([place] Stroke Program Early CT Score) - Ganglionic level infarction (caudate, lentiform nuclei, internal capsule, insula, M1-M3 cortex): 7 - Supraganglionic infarction (M4-M6 cortex): 3 Total score (0-10 with 10 being normal): 10 IMPRESSION: 1. No acute abnormality 2. ASPECTS is 10 3. Moderate atrophy and moderate chronic microvascular ischemic change  4. These results were called by telephone at the time of interpretation on 07/03/2018 at 12:40 pm to Dr. Amada Jupiter , who verbally acknowledged these results. Electronically Signed   By: Marlan Palau M.D.   On: 07/03/2018 12:41        Scheduled Meds:  atorvastatin  80 mg Oral Q2000   clopidogrel  75 mg Oral Daily   enoxaparin (LOVENOX) injection  40 mg Subcutaneous Q24H   feeding supplement (ENSURE ENLIVE)  237 mL Oral BID BM   mirtazapine  15 mg Oral QHS   sodium chloride flush  3 mL Intravenous Once   sodium chloride flush  3 mL Intravenous Q12H   Continuous Infusions:  sodium chloride       LOS: 0 days    Time spent: 35 minutes spent in the coordination of care of this patient today.    Teddy Spike, DO [facility] Pager [phone]  If 7PM-7AM, please contact night-coverage [email or web address] Password [facility] 07/04/2018, 3:37 PM

## 2018-07-04 NOTE — Progress Notes (Signed)
STROKE TEAM PROGRESS NOTE   INTERVAL HISTORY I have personally obtained history of presenting illness with the patient.  I have also reviewed imaging films personally.  Patient states her left-sided weakness has improved.  MRI scan shows no acute infarct.  MRA shows 50% proximal right ICA stenosis and severe intracranial right M2 and right P2 stenosis.  LDL cholesterol is elevated at 197 mg percent and hemoglobin A1c is borderline at 6.4.  Vitals:   07/04/18 0145 07/04/18 0422 07/04/18 0840 07/04/18 1230  BP: (!) 147/68 132/77 (!) 156/78 (!) 141/66  Pulse: 70 77 78 79  Resp: Temp:  98.5 F (36.9 C) 98.4 F (36.9 C) 98.5 F (36.9 C)  TempSrc:  Oral Axillary Axillary  SpO2: 96% 98% 100% 98%  Weight:        CBC:  Recent Labs  Lab 07/03/18 1221 07/03/18 1241 07/04/18 0356  WBC 8.5  --  7.8  NEUTROABS 5.4  --   --   HGB 12.9 12.6 12.8  HCT 40.6 37.0 39.0  MCV 93.1  --  90.7  PLT 254  --  228    Basic Metabolic Panel:  Recent Labs  Lab 07/03/18 1221 07/03/18 1241 07/04/18 0356  NA 143 141 140  K 3.7 3.7 3.7  CL 106 104 102  CO2 29  --  26  GLUCOSE 118* 112* 95  BUN CREATININE 0.86 0.70 0.88  CALCIUM 9.8  --  9.8   Lipid Panel:     Component Value Date/Time   CHOL 152 07/04/2018 0356   TRIG 103 07/04/2018 0356   HDL 52 07/04/2018 0356   CHOLHDL 2.9 07/04/2018 0356   VLDL 21 07/04/2018 0356   LDLCALC 79 07/04/2018 0356   HgbA1c:  Lab Results  Component Value Date   HGBA1C 6.4 (H) 07/04/2018   Urine Drug Screen:     Component Value Date/Time   LABOPIA NONE DETECTED 06/01/2017 1016   COCAINSCRNUR NONE DETECTED 06/01/2017 1016   LABBENZ NONE DETECTED 06/01/2017 1016   AMPHETMU NONE DETECTED 06/01/2017 1016   THCU NONE DETECTED 06/01/2017 1016   LABBARB NONE DETECTED 06/01/2017 1016    Alcohol Level     Component Value Date/Time   [facility] <10 06/01/2017 2207    IMAGING Mr [name] Neck W Wo Contrast  Result Date: 07/04/2018 CLINICAL  DATA:  Right facial droop EXAM: MR HEAD WITHOUT CONTRAST MR CIRCLE OF WILLIS WITHOUT CONTRAST MRA OF THE NECK WITHOUT AND WITH CONTRAST TECHNIQUE: Multiplanar, multiecho pulse sequences of the brain, circle of willis and surrounding structures were obtained without intravenous contrast. Angiographic images of the neck were obtained using MRA technique without and with intravenous contrast. CONTRAST:  6 mL Gadavist COMPARISON:  Head CT 07/03/2018 Brain MRI 06/01/2017 FINDINGS: MRI HEAD FINDINGS BRAIN: The midline structures are normal. There is no acute infarct, acute hemorrhage or mass. Diffuse confluent hyperintense T2-weighted signal within the periventricular, deep and juxtacortical white matter, most commonly due to chronic ischemic microangiopathy. Generalized atrophy without lobar predilection. Blood-sensitive sequences show no chronic microhemorrhage or superficial siderosis. SKULL AND UPPER CERVICAL SPINE: The visualized skull base, calvarium, upper cervical spine and extracranial soft tissues are normal. SINUSES/ORBITS: No fluid levels or advanced mucosal thickening. No mastoid or middle ear effusion. The orbits are normal. MRA HEAD FINDINGS POSTERIOR CIRCULATION: --Basilar artery: Normal. --Posterior cerebral arteries: Unchanged occlusion near the origin of the right PCA. Multifocal severe stenosis or short segment occlusion of the left PCA,  most evident at the proximal P2 segment. --Superior cerebellar arteries: Normal. --Inferior cerebellar arteries: Normal anterior and posterior inferior cerebellar arteries. ANTERIOR CIRCULATION: --Intracranial internal carotid arteries: Normal. --Anterior cerebral arteries: Both A1 segments are present. Patent anterior communicating artery. Short segment severe stenosis or occlusion of the distal left A2 segment, unchanged. --Middle cerebral arteries: Severe stenosis at the origin of the right M2 segment superior division, unchanged. --Posterior communicating arteries:  Diminutive or absent bilaterally. MRA NECK FINDINGS Aortic arch: Normal 3 vessel aortic branching pattern. The visualized subclavian arteries are normal. Right carotid system: Approximately 50% stenosis of the proximal right ICA. Left carotid system: Approximately 50% stenosis of the proximal left ICA. Vertebral arteries: Right dominant. Vertebral artery origins are normal. Vertebral arteries are normal in course and caliber to the vertebrobasilar confluence without stenosis or evidence of dissection. IMPRESSION: 1. No acute intracranial abnormality. 2. Chronic ischemic microangiopathy and generalized volume loss. 3. Unchanged multifocal posterior cerebral artery short segment occlusion and/or severe stenosis. 4. Unchanged right MCA M2 segment and left ACA A2 segment severe stenoses. 5. Approximately 50% stenosis both proximal internal carotid arteries, unchanged. Electronically Signed   By: Deatra Robinson M.D.   On: 07/04/2018 00:14   Mr Brain Wo Contrast  Result Date: 07/04/2018 CLINICAL DATA:  Right facial droop EXAM: MR HEAD WITHOUT CONTRAST MR CIRCLE OF WILLIS WITHOUT CONTRAST MRA OF THE NECK WITHOUT AND WITH CONTRAST TECHNIQUE: Multiplanar, multiecho pulse sequences of the brain, circle of willis and surrounding structures were obtained without intravenous contrast. Angiographic images of the neck were obtained using MRA technique without and with intravenous contrast. CONTRAST:  6 mL Gadavist COMPARISON:  Head CT 07/03/2018 Brain MRI 06/01/2017 FINDINGS: MRI HEAD FINDINGS BRAIN: The midline structures are normal. There is no acute infarct, acute hemorrhage or mass. Diffuse confluent hyperintense T2-weighted signal within the periventricular, deep and juxtacortical white matter, most commonly due to chronic ischemic microangiopathy. Generalized atrophy without lobar predilection. Blood-sensitive sequences show no chronic microhemorrhage or superficial siderosis. SKULL AND UPPER CERVICAL SPINE: The  visualized skull base, calvarium, upper cervical spine and extracranial soft tissues are normal. SINUSES/ORBITS: No fluid levels or advanced mucosal thickening. No mastoid or middle ear effusion. The orbits are normal. MRA HEAD FINDINGS POSTERIOR CIRCULATION: --Basilar artery: Normal. --Posterior cerebral arteries: Unchanged occlusion near the origin of the right PCA. Multifocal severe stenosis or short segment occlusion of the left PCA, most evident at the proximal P2 segment. --Superior cerebellar arteries: Normal. --Inferior cerebellar arteries: Normal anterior and posterior inferior cerebellar arteries. ANTERIOR CIRCULATION: --Intracranial internal carotid arteries: Normal. --Anterior cerebral arteries: Both A1 segments are present. Patent anterior communicating artery. Short segment severe stenosis or occlusion of the distal left A2 segment, unchanged. --Middle cerebral arteries: Severe stenosis at the origin of the right M2 segment superior division, unchanged. --Posterior communicating arteries: Diminutive or absent bilaterally. MRA NECK FINDINGS Aortic arch: Normal 3 vessel aortic branching pattern. The visualized subclavian arteries are normal. Right carotid system: Approximately 50% stenosis of the proximal right ICA. Left carotid system: Approximately 50% stenosis of the proximal left ICA. Vertebral arteries: Right dominant. Vertebral artery origins are normal. Vertebral arteries are normal in course and caliber to the vertebrobasilar confluence without stenosis or evidence of dissection. IMPRESSION: 1. No acute intracranial abnormality. 2. Chronic ischemic microangiopathy and generalized volume loss. 3. Unchanged multifocal posterior cerebral artery short segment occlusion and/or severe stenosis. 4. Unchanged right MCA M2 segment and left ACA A2 segment severe stenoses. 5. Approximately 50% stenosis both proximal internal carotid arteries,  unchanged. Electronically Signed   By: Deatra Robinson M.D.   On:  07/04/2018 00:14   Dg Chest Portable 1 View  Result Date: 07/03/2018 CLINICAL DATA:  Weakness EXAM: PORTABLE CHEST 1 VIEW COMPARISON:  06/01/2017 FINDINGS: Cardiac shadows within normal limits. Mitral valve calcifications are noted. The lungs are clear bilaterally. No acute bony abnormality is seen. IMPRESSION: No acute abnormality noted. Electronically Signed   By: Alcide Clever M.D.   On: 07/03/2018 13:01   Mr [name] Head Wo Contrast  Result Date: 07/04/2018 CLINICAL DATA:  Right facial droop EXAM: MR HEAD WITHOUT CONTRAST MR CIRCLE OF WILLIS WITHOUT CONTRAST MRA OF THE NECK WITHOUT AND WITH CONTRAST TECHNIQUE: Multiplanar, multiecho pulse sequences of the brain, circle of willis and surrounding structures were obtained without intravenous contrast. Angiographic images of the neck were obtained using MRA technique without and with intravenous contrast. CONTRAST:  6 mL Gadavist COMPARISON:  Head CT 07/03/2018 Brain MRI 06/01/2017 FINDINGS: MRI HEAD FINDINGS BRAIN: The midline structures are normal. There is no acute infarct, acute hemorrhage or mass. Diffuse confluent hyperintense T2-weighted signal within the periventricular, deep and juxtacortical white matter, most commonly due to chronic ischemic microangiopathy. Generalized atrophy without lobar predilection. Blood-sensitive sequences show no chronic microhemorrhage or superficial siderosis. SKULL AND UPPER CERVICAL SPINE: The visualized skull base, calvarium, upper cervical spine and extracranial soft tissues are normal. SINUSES/ORBITS: No fluid levels or advanced mucosal thickening. No mastoid or middle ear effusion. The orbits are normal. MRA HEAD FINDINGS POSTERIOR CIRCULATION: --Basilar artery: Normal. --Posterior cerebral arteries: Unchanged occlusion near the origin of the right PCA. Multifocal severe stenosis or short segment occlusion of the left PCA, most evident at the proximal P2 segment. --Superior cerebellar arteries: Normal. --Inferior  cerebellar arteries: Normal anterior and posterior inferior cerebellar arteries. ANTERIOR CIRCULATION: --Intracranial internal carotid arteries: Normal. --Anterior cerebral arteries: Both A1 segments are present. Patent anterior communicating artery. Short segment severe stenosis or occlusion of the distal left A2 segment, unchanged. --Middle cerebral arteries: Severe stenosis at the origin of the right M2 segment superior division, unchanged. --Posterior communicating arteries: Diminutive or absent bilaterally. MRA NECK FINDINGS Aortic arch: Normal 3 vessel aortic branching pattern. The visualized subclavian arteries are normal. Right carotid system: Approximately 50% stenosis of the proximal right ICA. Left carotid system: Approximately 50% stenosis of the proximal left ICA. Vertebral arteries: Right dominant. Vertebral artery origins are normal. Vertebral arteries are normal in course and caliber to the vertebrobasilar confluence without stenosis or evidence of dissection. IMPRESSION: 1. No acute intracranial abnormality. 2. Chronic ischemic microangiopathy and generalized volume loss. 3. Unchanged multifocal posterior cerebral artery short segment occlusion and/or severe stenosis. 4. Unchanged right MCA M2 segment and left ACA A2 segment severe stenoses. 5. Approximately 50% stenosis both proximal internal carotid arteries, unchanged. Electronically Signed   By: Deatra Robinson M.D.   On: 07/04/2018 00:14   Ct Head Code Stroke Wo Contrast  Result Date: 07/03/2018 CLINICAL DATA:  Code stroke.  Left-sided weakness EXAM: CT HEAD WITHOUT CONTRAST TECHNIQUE: Contiguous axial images were obtained from the base of the skull through the vertex without intravenous contrast. COMPARISON:  MRI head 06/01/2017, CT 06/01/2017 FINDINGS: Brain: Moderate atrophy. Moderate chronic microvascular ischemic change in the white matter. Hypodensity right cerebellum consistent with chronic subarachnoid space. Negative for acute  infarct, hemorrhage, or mass. Vascular: Negative for hyperdense vessel. Atherosclerotic calcification Skull: Negative Sinuses/Orbits: Mild mucosal edema paranasal sinuses. Right cataract surgery. Other: None ASPECTS (Alberta Stroke Program Early CT Score) - Ganglionic level infarction (  caudate, lentiform nuclei, internal capsule, insula, M1-M3 cortex): 7 - Supraganglionic infarction (M4-M6 cortex): 3 Total score (0-10 with 10 being normal): 10 IMPRESSION: 1. No acute abnormality 2. ASPECTS is 10 3. Moderate atrophy and moderate chronic microvascular ischemic change 4. These results were called by telephone at the time of interpretation on 07/03/2018 at 12:40 pm to Dr. Amada Jupiter , who verbally acknowledged these results. Electronically Signed   By: Marlan Palau M.D.   On: 07/03/2018 12:41    PHYSICAL EXAM Pleasant elderly Caucasian lady not in distress. . Afebrile. Head is nontraumatic. Neck is supple without bruit.    Cardiac exam no murmur or gallop. Lungs are clear to auscultation. Distal pulses are well felt. Neurological Exam ;  Awake  Alert oriented x 2.  Diminished attention, registration and recall.  Poor insight into her illness.. Normal speech and language.eye movements full without nystagmus.fundi were not visualized. Vision acuity and fields appear normal. Hearing is diminished bilaterally. Palatal movements are normal. Face asymmetric with left lower facial weakness.. Tongue midline. Normal strength, tone, reflexes and coordination. Normal sensation. Gait deferred.  ASSESSMENT/PLAN Alexandra Mckee is a 85 y.o. female with history of hypertension, hyperlipidemia, hypokalemia and previous stroke  presenting from her nursing home with right facial droop and confusion.   Likely right brain TIA from small vessel disease  Code Stroke CT head No acute stroke. Small vessel disease. Atrophy. ASPECTS 10.     MRI no acute stroke.  Small vessel disease.  Atrophy.  MRA head unchanged multifocal  posterior cerebral artery short segment occlusion/stenosis.  Unchanged R M2 and L A2 severe stenosis.  MRA neck unchanged bilateral ICA 50% stenosis   2D Echo moderate calcification of the mitral valve with severe annular calcification.  Small mobile calcified nodule on posterior leaflet may be just calcification of the mitral valve but cannot rule out vegetation.  Consider TEE if concern for endocarditis.  Severe aortic valve calcification.  Dilatation of the aortic root and ascending aorta.  EF 60 to 65%.  Left atrial size mildly dilated.  LDL 79  HgbA1c 6.4  Lovenox 40 mg sq daily for VTE prophylaxis  clopidogrel 75 mg daily prior to admission, now on clopidogrel 75 mg daily and aspirin 81 mg daily.   Therapy recommendations: SNF  Disposition: Pending  Hypertension  Stable . BP goal normotensive as no stroke seen on imaging  Hyperlipidemia  Home meds: Lipitor 80, resumed in hospital  LDL 79, goal < 70  Continue statin at discharge  Pre- Diabetes  Home meds:  none  HgbA1c 6.4, at goal < 7.0  Other Stroke Risk Factors  Advanced age  Hx stroke/TIA  05/2017 -  Right paramedian pontine infarct secondary to small vessel disease source  Hospital day # 0 I have personally obtained history,examined this patient, reviewed notes, independently viewed imaging studies, participated in medical decision making and plan of care.ROS completed by me personally and pertinent positives fully documented  I have made any additions or clarifications directly to the above note.  She presented with transient left hemiparesis and facial weakness likely from her right brain TIA from small vessel disease.  Recommend aspirin and Plavix for 3 weeks followed by aspirin alone.  Aggressive risk factor modification.  Long discussion with the patient and Dr. Delila Pereyra about TIA and evaluation and treatment plan and answered questions.  Greater than 50% time during this 25-minute visit was spent on  counseling and coordination of care about TIA and answering questions.  Stroke team will sign off.  Follow-up as an outpatient stroke clinic in 6 weeks.  Delia HeadyPramod , MD Medical Director [facility] Pager: [phone] 07/04/2018 4:32 PM   To contact Stroke Continuity provider, please refer to [email or web address]. After hours, contact General Neurology

## 2018-07-04 NOTE — Evaluation (Signed)
Speech Language Pathology Evaluation Patient Details Name: Alexandra Mckee MRN: [id number] DOB: Oct 24, 1921 Today's Date: 07/04/2018 Time: 4034-7425 SLP Time Calculation (min) (ACUTE ONLY): 25 min  Problem List:  Patient Active Problem List   Diagnosis Date Noted  . TIA (transient ischemic attack) 07/03/2018  . Essential hypertension 07/03/2018  . Stroke (HCC) 06/01/2017  . Memory loss 08/28/2013   Past Medical History:  Past Medical History:  Diagnosis Date  . Hyperlipidemia   . Hypertension   . Hypokalemia   . Stroke [facility])    Past Surgical History:  Past Surgical History:  Procedure Laterality Date  . ABDOMINAL HYSTERECTOMY     HPI:  Pt is a 85 y.o. female with medical history significant of hypertension, hyperlipidemia, and previous stroke who presented with a right facial droop. MRI of the brain was negative for acute changes.    Assessment / Plan / Recommendation Clinical Impression  Pt denied any baseline deficits in speech, language, or cognition; however, considering her performance during the evaluation, her reliability as a historian is questioned. She required frequent encouragement to participate in the evaluation and despite education regarding the purpose of the evaluation, expressed confusion regarding the purpose of " doing all this crazy stuff". Her expressive language skills were within functional limits but she demonstrated difficulty following 3-step commands and responding to questions related to paragraph-level material. In the area of cognition she demonstrated deficits in the areas of memory, awareness, orientation, and reasoning but was resistant to further participation in cognitive-linguistic assessment. SLP services will be continued to target the aforementioned deficits.     SLP Assessment  SLP Recommendation/Assessment: Patient needs continued Speech Lanaguage Pathology Services SLP Visit Diagnosis: Cognitive communication deficit (R41.841)    Follow Up  Recommendations  Skilled Nursing facility    Frequency and Duration min 2x/week  2 weeks      SLP Evaluation Cognition  Overall Cognitive Status: No family/caregiver present to determine baseline cognitive functioning Arousal/Alertness: Awake/alert Orientation Level: Oriented to place;Oriented to person;Disoriented to time;Oriented to situation Attention: Focused Focused Attention: Appears intact Memory: Impaired Memory Impairment: Storage deficit;Retrieval deficit;Decreased recall of new information(Immediate: 3/3; Dealyed: 0/3 ) Awareness: Impaired Awareness Impairment: Intellectual impairment Executive Function: Reasoning Reasoning: Impaired Reasoning Impairment: Verbal complex       Comprehension  Auditory Comprehension Overall Auditory Comprehension: Impaired Yes/No Questions: Impaired Basic Biographical Questions: (5/5) Complex Questions: (4/5) Paragraph Comprehension (via yes/no questions): (1/4) Commands: Impaired Two Step Basic Commands: (3/4) Multistep Basic Commands: (1/4) Reading Comprehension Reading Status: Not tested    Expression Expression Primary Mode of Expression: Verbal Verbal Expression Overall Verbal Expression: Appears within functional limits for tasks assessed Initiation: No impairment Automatic Speech: Counting;Day of week;Month of year(WNL) Level of Generative/Spontaneous Verbalization: Sentence Repetition: No impairment Naming: No impairment Pragmatics: No impairment Interfering Components: Attention Written Expression Dominant Hand: Right   Oral / Motor  Oral Motor/Sensory Function Overall Oral Motor/Sensory Function: Within functional limits Motor Speech Overall Motor Speech: Appears within functional limits for tasks assessed Respiration: Within functional limits Phonation: Normal Resonance: Within functional limits Articulation: Within functional limitis Intelligibility: Intelligible Motor Planning: Witnin functional  limits Motor Speech Errors: Not applicable   [name] I. Vear Clock, MS, CCC-SLP Acute Rehabilitation Services Office number [phone] Pager [phone]                   Scheryl Marten 07/04/2018, 2:30 PM

## 2018-07-04 NOTE — TOC Initial Note (Signed)
Transition of Care [facility]) - Initial/Assessment Note    Patient Details  Name: Alexandra Mckee MRN: [id number] Date of Birth: [birth year]  [facility] of [facility] CM/SW Contact:    Kermit Balo, RN Phone Number: 07/04/2018, 3:14 PM  Clinical Narrative:                 CM spoke to pts daughter: Alexandra Mckee and she was able to provide needed information.  Daughter usually takes her to her MD appts.  Pt also requires assistance with eating at times.    Expected Discharge Plan: Assisted Living Barriers to Discharge: Continued Medical Work up   Patient Goals and CMS Choice        Expected Discharge Plan and Services Expected Discharge Plan: Assisted Living In-house Referral: Clinical Social Work     Living arrangements for the past 2 months: [facility]isted Living Facility([facility]                                      Prior Living Arrangements/Services Living arrangements for the past 2 months: Assisted Living Facility([facility])   Patient language and need for interpreter reviewed:: Yes(no needs) Do you feel safe going back to the place where you live?: Yes(family feels safe)          Current home services: DME(3 in 1 and walker) Criminal Activity/Legal Involvement Pertinent to Current Situation/Hospitalization: No - Comment as needed  Activities of Daily Living      Permission Sought/Granted                  Emotional Assessment Appearance:: Appears stated age Attitude/Demeanor/Rapport: Lethargic Affect (typically observed): Accepting Orientation: : Oriented to Self, Oriented to Place   Psych Involvement: No (comment)  Admission diagnosis:  Cerebrovascular accident (CVA), unspecified mechanism (HCC) [I63.9] Patient Active Problem List   Diagnosis Date Noted  . TIA (transient ischemic attack) 07/03/2018  . Essential hypertension 07/03/2018  . Stroke (HCC) 06/01/2017  . Memory loss 08/28/2013   PCP:  Tally Joe, MD Pharmacy:   [facility] [place], [place] - [address] [facility]. [address] [place] [address] Phone: [phone] Fax: [phone]     Social Determinants of Health (SDOH) Interventions    Readmission Risk Interventions No flowsheet data found.

## 2018-07-04 NOTE — Progress Notes (Signed)
Initial Nutrition Assessment   RD working remotely.  DOCUMENTATION CODES:   Not applicable  INTERVENTION:  Provide Ensure Enlive po BID, each supplement provides 350 kcal and 20 grams of protein.  Encourage adequate PO intake.   NUTRITION DIAGNOSIS:   Increased nutrient needs related to wound healing as evidenced by estimated needs.  GOAL:   Patient will meet greater than or equal to 90% of their needs  MONITOR:   PO intake, Supplement acceptance, Labs, Weight trends, I & O's, Skin  REASON FOR ASSESSMENT:   Low Braden    ASSESSMENT:   85 y.o. female with medical history significant of hypertension, hyperlipidemia, previous stroke who presents with a right facial droop.   RD unable to obtain nutrition history from attempt at contacting pt via inpatient room phone. RD to order nutritional supplements to aid in caloric and protein needs as well as in wound healing.   Unable to complete Nutrition-Focused physical exam at this time.   Labs and medications reviewed.   Diet Order:   Diet Order            Diet Heart Room service appropriate? Yes; Fluid consistency: Thin  Diet effective now              EDUCATION NEEDS:   Not appropriate for education at this time  Skin:  Skin Assessment: Skin Integrity Issues: Skin Integrity Issues:: Other (Comment) Other: stage 2 sore L hip  Last BM:  5/12  Height:   Ht Readings from Last 1 Encounters:  07/03/17 5\' 4"  (1.626 m)    Weight:   Wt Readings from Last 1 Encounters:  07/03/18 59.2 kg    Ideal Body Weight:  54.5 kg  BMI:  Body mass index is 22.4 kg/m.  Estimated Nutritional Needs:   Kcal:  1450-1650  Protein:  60-70 grams  Fluid:  >/= 1.5 L/day    Roslyn Smiling, MS, RD, LDN Pager # [phone] After hours/ weekend pager # [phone]

## 2018-07-05 LAB — URINALYSIS, ROUTINE W REFLEX MICROSCOPIC
Bilirubin Urine: NEGATIVE
Glucose, UA: NEGATIVE mg/dL
Ketones, ur: 5 mg/dL — AB
Leukocytes,Ua: NEGATIVE
Nitrite: POSITIVE — AB
Protein, ur: 30 mg/dL — AB
Specific Gravity, Urine: 1.024 (ref 1.005–1.030)
pH: 5 (ref 5.0–8.0)

## 2018-07-05 MED ORDER — DEXTROSE-NACL 5-0.9 % IV SOLN
INTRAVENOUS | Status: DC
  Administered 2018-07-05 – 2018-07-09 (×9): via INTRAVENOUS

## 2018-07-05 MED ORDER — SODIUM CHLORIDE 0.9 % IV SOLN: INTRAVENOUS | Status: DC

## 2018-07-05 MED ORDER — HYDRALAZINE HCL 20 MG/ML IJ SOLN: 5.0000 mg | Freq: Four times a day (QID) | INTRAMUSCULAR | Status: DC | PRN

## 2018-07-05 NOTE — Progress Notes (Signed)
  Speech Language Pathology Treatment: Cognitive-Linquistic  Patient Details Name: Alexandra Mckee MRN: [id number] DOB: Feb 08, 1922 Today's Date: 07/05/2018 Time: 4627-0350 SLP Time Calculation (min) (ACUTE ONLY): 16 min  Assessment / Plan / Recommendation Clinical Impression  Pt was seen for cognitive-linguistic treatment session. She was alert throughout the session but presented with a flat affect and required encouragement to participate in the session. Despite her flat affect, occasional instances of smiling were noted which was not the case during the initial evaluation. She was re-educated regarding the results of yesterday's evaluation and reminded regarding the purpose and benefits of treatment. She verbalized understanding regarding this but subsequently required frequent reminders regarding the purpose and benefits of the session. She was oriented to person and place and was able to provide appropriate responses for time orientation questions with min cues. She reported that she was managing her own medications prior to admission and achieved 60% accuracy with a medication management (prescription) task increasing to 100% accuracy with mod cues. Mod-max cues were required during a word deduction task for reasoning. SLP will continue to follow pt.     HPI HPI: Pt is a 85 y.o. female with medical history significant of hypertension, hyperlipidemia, and previous stroke who presented with a right facial droop. MRI of the brain was negative for acute changes.       SLP Plan  Continue with current plan of care       Recommendations                   Follow up Recommendations: Skilled Nursing facility;24 hour supervision/assistance SLP Visit Diagnosis: Cognitive communication deficit (K93.818) Plan: Continue with current plan of care       Alexandra Mckee I. Vear Clock, MS, CCC-SLP Acute Rehabilitation Services Office number [phone] Pager [phone]                Alexandra Mckee 07/05/2018, 2:38 PM

## 2018-07-05 NOTE — Progress Notes (Signed)
[name].  PROGRESS NOTE    Alexandra Mckee  UJW:[id number] DOB: [birth year] DOA: 07/03/2018 PCP: Tally JoeSwayne, David, MD   Brief Narrative:   Alexandra Mckee a 85 y.o.femalewith medical history significant ofhypertension, hyperlipidemia, previous stroke who presents with a right facial droop. She was last seen normal at the nursing home at 8:30 in the morning. Approximately of 11 AM, physical therapy came in to work with her and noticed that patient had a right facial droop and was more confused. EMS was called and patient was brought in as a code stroke. She denies any complaints on my examination. She denies any fevers, chills, chest pain, cough, shortness of breath, nausea, vomiting, diarrhea or abdominal pain   Assessment & Plan:   Principal Problem:   TIA (transient ischemic attack) Active Problems:   Essential hypertension   TIA     - MRI Brain: IMPRESSION: 1. No acute intracranial abnormality. 2. Chronic ischemic microangiopathy and generalized volume loss. 3. Unchanged multifocal posterior cerebral artery short segment occlusion and/or severe stenosis. 4. Unchanged right MCA M2 segment and left ACA A2 segment severe stenoses. 5. Approximately 50% stenosis both proximal internal carotid arteries, unchanged.     - she has persistent facial droop and some LUE weakness that is asymmetrical.      - SLP cognition complete; needs SNF      - she is not safe for home discharge at this point  HTN     - can start back HCTZ   DVT prophylaxis: lovenox Code Status: DNR Family Communication: None   Disposition Plan: To SNF   Consultants:   Neurology   Subjective: Intermittently refusing meds/food  Objective: Vitals:   07/05/18 0122 07/05/18 0509 07/05/18 0908 07/05/18 1256  BP: (!) 169/71 (!) 158/75 (!) 172/77 (!) 146/76  Pulse: 79 77 77 71  Resp: 18 18 13 12   Temp: 98 F (36.7 C) 97.8 F (36.6 C) 97.8 F (36.6 C) 98 F (36.7 C)  TempSrc: Oral Oral Oral Oral  SpO2: 97% 96% 96%  100%  Weight:        Intake/Output Summary (Last 24 hours) at 07/05/2018 1601 Last data filed at 07/05/2018 0522 Gross per 24 hour  Intake --  Output 100 ml  Net -100 ml   Filed Weights   07/03/18 1200  Weight: 59.2 kg    Examination:  General: 85 y.o. female resting in bed in NAD Cardiovascular: RRR, +S1, S2, 2/6 SEM, no g/r, equal pulses throughout Respiratory: CTABL, no w/r/r, normal WOB GI: BS+, NDNT, no masses noted, no organomegaly noted MSK: No e/c/c Skin: No rashes, bruises, ulcerations noted Neuro: follows commands, nods yes/no, still with persistent facial droop, LUE weakness, and BLE  weakness    Data Reviewed: I have personally reviewed following labs and imaging studies.  CBC: Recent Labs  Lab 07/03/18 1221 07/03/18 1241 07/04/18 0356  WBC 8.5  --  7.8  NEUTROABS 5.4  --   --   HGB 12.9 12.6 12.8  HCT 40.6 37.0 39.0  MCV 93.1  --  90.7  PLT 254  --  228   Basic Metabolic Panel: Recent Labs  Lab 07/03/18 1221 07/03/18 1241 07/04/18 0356  NA 143 141 140  K 3.7 3.7 3.7  CL 106 104 102  CO2 29  --  26  GLUCOSE 118* 112* 95  BUN 15 19 14   CREATININE 0.86 0.70 0.88  CALCIUM 9.8  --  9.8   GFR: CrCl cannot be calculated (Unknown ideal  weight.). Liver Function Tests: Recent Labs  Lab 07/03/18 1221  AST 53*  ALT 32  ALKPHOS 106  BILITOT 0.7  PROT 7.9  ALBUMIN 3.2*   No results for input(s): LIPASE, AMYLASE in the last 168 hours. No results for input(s): AMMONIA in the last 168 hours. Coagulation Profile: Recent Labs  Lab 07/03/18 1221  INR 1.0   Cardiac Enzymes: No results for input(s): CKTOTAL, CKMB, CKMBINDEX, TROPONINI in the last 168 hours. BNP (last 3 results) No results for input(s): PROBNP in the last 8760 hours. HbA1C: Recent Labs    07/04/18 0356  HGBA1C 6.4*   CBG: Recent Labs  Lab 07/03/18 1221 07/03/18 1223  GLUCAP 107* 100*   Lipid Profile: Recent Labs    07/04/18 0356  CHOL 152  HDL 52  LDLCALC 79   TRIG 923  CHOLHDL 2.9   Thyroid Function Tests: No results for input(s): TSH, T4TOTAL, FREET4, T3FREE, THYROIDAB in the last 72 hours. Anemia Panel: No results for input(s): VITAMINB12, FOLATE, FERRITIN, TIBC, IRON, RETICCTPCT in the last 72 hours. Sepsis Labs: No results for input(s): PROCALCITON, LATICACIDVEN in the last 168 hours.  Recent Results (from the past 240 hour(s))  SARS Coronavirus 2 (CEPHEID - Performed in [facility] hospital lab), Hosp Order     Status: None   Collection Time: 07/03/18 12:49 PM  Result Value Ref Range Status   SARS Coronavirus 2 NEGATIVE NEGATIVE Final    Comment: (NOTE) If result is NEGATIVE SARS-CoV-2 target nucleic acids are NOT DETECTED. The SARS-CoV-2 RNA is generally detectable in upper and lower  respiratory specimens during the acute phase of infection. The lowest  concentration of SARS-CoV-2 viral copies this assay can detect is 250  copies / mL. A negative result does not preclude SARS-CoV-2 infection  and should not be used as the sole basis for treatment or other  patient management decisions.  A negative result may occur with  improper specimen collection / handling, submission of specimen other  than nasopharyngeal swab, presence of viral mutation(s) within the  areas targeted by this assay, and inadequate number of viral copies  (<250 copies / mL). A negative result must be combined with clinical  observations, patient history, and epidemiological information. If result is POSITIVE SARS-CoV-2 target nucleic acids are DETECTED. The SARS-CoV-2 RNA is generally detectable in upper and lower  respiratory specimens dur ing the acute phase of infection.  Positive  results are indicative of active infection with SARS-CoV-2.  Clinical  correlation with patient history and other diagnostic information is  necessary to determine patient infection status.  Positive results do  not rule out bacterial infection or co-infection with other  viruses. If result is PRESUMPTIVE POSTIVE SARS-CoV-2 nucleic acids MAY BE PRESENT.   A presumptive positive result was obtained on the submitted specimen  and confirmed on repeat testing.  While 2019 novel coronavirus  (SARS-CoV-2) nucleic acids may be present in the submitted sample  additional confirmatory testing may be necessary for epidemiological  and / or clinical management purposes  to differentiate between  SARS-CoV-2 and other Sarbecovirus currently known to infect humans.  If clinically indicated additional testing with an alternate test  methodology [phone]) is advised. The SARS-CoV-2 RNA is generally  detectable in upper and lower respiratory sp ecimens during the acute  phase of infection. The expected result is Negative. Fact Sheet for Patients:  BoilerBrush.com.cy Fact Sheet for Healthcare Providers: https://pope.com/ This test is not yet approved or cleared by the [place] FDA and  has been authorized for detection and/or diagnosis of SARS-CoV-2 by FDA under an Emergency Use Authorization (EUA).  This EUA will remain in effect (meaning this test can be used) for the duration of the COVID-19 declaration under Section 564(b)(1) of the Act, 21 U.S.C. section 360bbb-3(b)(1), unless the authorization is terminated or revoked sooner. Performed at [facility], [address], [place], [place] [address]          Radiology Studies: Mr [name] Neck W Wo Contrast  Result Date: 07/04/2018 CLINICAL DATA:  Right facial droop EXAM: MR HEAD WITHOUT CONTRAST MR CIRCLE OF WILLIS WITHOUT CONTRAST MRA OF THE NECK WITHOUT AND WITH CONTRAST TECHNIQUE: Multiplanar, multiecho pulse sequences of the brain, circle of willis and surrounding structures were obtained without intravenous contrast. Angiographic images of the neck were obtained using MRA technique without and with intravenous contrast. CONTRAST:  6 mL Gadavist COMPARISON:   Head CT 07/03/2018 Brain MRI 06/01/2017 FINDINGS: MRI HEAD FINDINGS BRAIN: The midline structures are normal. There is no acute infarct, acute hemorrhage or mass. Diffuse confluent hyperintense T2-weighted signal within the periventricular, deep and juxtacortical white matter, most commonly due to chronic ischemic microangiopathy. Generalized atrophy without lobar predilection. Blood-sensitive sequences show no chronic microhemorrhage or superficial siderosis. SKULL AND UPPER CERVICAL SPINE: The visualized skull base, calvarium, upper cervical spine and extracranial soft tissues are normal. SINUSES/ORBITS: No fluid levels or advanced mucosal thickening. No mastoid or middle ear effusion. The orbits are normal. MRA HEAD FINDINGS POSTERIOR CIRCULATION: --Basilar artery: Normal. --Posterior cerebral arteries: Unchanged occlusion near the origin of the right PCA. Multifocal severe stenosis or short segment occlusion of the left PCA, most evident at the proximal P2 segment. --Superior cerebellar arteries: Normal. --Inferior cerebellar arteries: Normal anterior and posterior inferior cerebellar arteries. ANTERIOR CIRCULATION: --Intracranial internal carotid arteries: Normal. --Anterior cerebral arteries: Both A1 segments are present. Patent anterior communicating artery. Short segment severe stenosis or occlusion of the distal left A2 segment, unchanged. --Middle cerebral arteries: Severe stenosis at the origin of the right M2 segment superior division, unchanged. --Posterior communicating arteries: Diminutive or absent bilaterally. MRA NECK FINDINGS Aortic arch: Normal 3 vessel aortic branching pattern. The visualized subclavian arteries are normal. Right carotid system: Approximately 50% stenosis of the proximal right ICA. Left carotid system: Approximately 50% stenosis of the proximal left ICA. Vertebral arteries: Right dominant. Vertebral artery origins are normal. Vertebral arteries are normal in course and caliber  to the vertebrobasilar confluence without stenosis or evidence of dissection. IMPRESSION: 1. No acute intracranial abnormality. 2. Chronic ischemic microangiopathy and generalized volume loss. 3. Unchanged multifocal posterior cerebral artery short segment occlusion and/or severe stenosis. 4. Unchanged right MCA M2 segment and left ACA A2 segment severe stenoses. 5. Approximately 50% stenosis both proximal internal carotid arteries, unchanged. Electronically Signed   By: Deatra Robinson M.D.   On: 07/04/2018 00:14   Mr Brain Wo Contrast  Result Date: 07/04/2018 CLINICAL DATA:  Right facial droop EXAM: MR HEAD WITHOUT CONTRAST MR CIRCLE OF WILLIS WITHOUT CONTRAST MRA OF THE NECK WITHOUT AND WITH CONTRAST TECHNIQUE: Multiplanar, multiecho pulse sequences of the brain, circle of willis and surrounding structures were obtained without intravenous contrast. Angiographic images of the neck were obtained using MRA technique without and with intravenous contrast. CONTRAST:  6 mL Gadavist COMPARISON:  Head CT 07/03/2018 Brain MRI 06/01/2017 FINDINGS: MRI HEAD FINDINGS BRAIN: The midline structures are normal. There is no acute infarct, acute hemorrhage or mass. Diffuse confluent hyperintense T2-weighted signal within the periventricular, deep and  juxtacortical white matter, most commonly due to chronic ischemic microangiopathy. Generalized atrophy without lobar predilection. Blood-sensitive sequences show no chronic microhemorrhage or superficial siderosis. SKULL AND UPPER CERVICAL SPINE: The visualized skull base, calvarium, upper cervical spine and extracranial soft tissues are normal. SINUSES/ORBITS: No fluid levels or advanced mucosal thickening. No mastoid or middle ear effusion. The orbits are normal. MRA HEAD FINDINGS POSTERIOR CIRCULATION: --Basilar artery: Normal. --Posterior cerebral arteries: Unchanged occlusion near the origin of the right PCA. Multifocal severe stenosis or short segment occlusion of the left  PCA, most evident at the proximal P2 segment. --Superior cerebellar arteries: Normal. --Inferior cerebellar arteries: Normal anterior and posterior inferior cerebellar arteries. ANTERIOR CIRCULATION: --Intracranial internal carotid arteries: Normal. --Anterior cerebral arteries: Both A1 segments are present. Patent anterior communicating artery. Short segment severe stenosis or occlusion of the distal left A2 segment, unchanged. --Middle cerebral arteries: Severe stenosis at the origin of the right M2 segment superior division, unchanged. --Posterior communicating arteries: Diminutive or absent bilaterally. MRA NECK FINDINGS Aortic arch: Normal 3 vessel aortic branching pattern. The visualized subclavian arteries are normal. Right carotid system: Approximately 50% stenosis of the proximal right ICA. Left carotid system: Approximately 50% stenosis of the proximal left ICA. Vertebral arteries: Right dominant. Vertebral artery origins are normal. Vertebral arteries are normal in course and caliber to the vertebrobasilar confluence without stenosis or evidence of dissection. IMPRESSION: 1. No acute intracranial abnormality. 2. Chronic ischemic microangiopathy and generalized volume loss. 3. Unchanged multifocal posterior cerebral artery short segment occlusion and/or severe stenosis. 4. Unchanged right MCA M2 segment and left ACA A2 segment severe stenoses. 5. Approximately 50% stenosis both proximal internal carotid arteries, unchanged. Electronically Signed   By: Deatra Robinson M.D.   On: 07/04/2018 00:14   Mr [name] Head Wo Contrast  Result Date: 07/04/2018 CLINICAL DATA:  Right facial droop EXAM: MR HEAD WITHOUT CONTRAST MR CIRCLE OF WILLIS WITHOUT CONTRAST MRA OF THE NECK WITHOUT AND WITH CONTRAST TECHNIQUE: Multiplanar, multiecho pulse sequences of the brain, circle of willis and surrounding structures were obtained without intravenous contrast. Angiographic images of the neck were obtained using MRA technique  without and with intravenous contrast. CONTRAST:  6 mL Gadavist COMPARISON:  Head CT 07/03/2018 Brain MRI 06/01/2017 FINDINGS: MRI HEAD FINDINGS BRAIN: The midline structures are normal. There is no acute infarct, acute hemorrhage or mass. Diffuse confluent hyperintense T2-weighted signal within the periventricular, deep and juxtacortical white matter, most commonly due to chronic ischemic microangiopathy. Generalized atrophy without lobar predilection. Blood-sensitive sequences show no chronic microhemorrhage or superficial siderosis. SKULL AND UPPER CERVICAL SPINE: The visualized skull base, calvarium, upper cervical spine and extracranial soft tissues are normal. SINUSES/ORBITS: No fluid levels or advanced mucosal thickening. No mastoid or middle ear effusion. The orbits are normal. MRA HEAD FINDINGS POSTERIOR CIRCULATION: --Basilar artery: Normal. --Posterior cerebral arteries: Unchanged occlusion near the origin of the right PCA. Multifocal severe stenosis or short segment occlusion of the left PCA, most evident at the proximal P2 segment. --Superior cerebellar arteries: Normal. --Inferior cerebellar arteries: Normal anterior and posterior inferior cerebellar arteries. ANTERIOR CIRCULATION: --Intracranial internal carotid arteries: Normal. --Anterior cerebral arteries: Both A1 segments are present. Patent anterior communicating artery. Short segment severe stenosis or occlusion of the distal left A2 segment, unchanged. --Middle cerebral arteries: Severe stenosis at the origin of the right M2 segment superior division, unchanged. --Posterior communicating arteries: Diminutive or absent bilaterally. MRA NECK FINDINGS Aortic arch: Normal 3 vessel aortic branching pattern. The visualized subclavian arteries are normal. Right carotid system: Approximately  50% stenosis of the proximal right ICA. Left carotid system: Approximately 50% stenosis of the proximal left ICA. Vertebral arteries: Right dominant. Vertebral  artery origins are normal. Vertebral arteries are normal in course and caliber to the vertebrobasilar confluence without stenosis or evidence of dissection. IMPRESSION: 1. No acute intracranial abnormality. 2. Chronic ischemic microangiopathy and generalized volume loss. 3. Unchanged multifocal posterior cerebral artery short segment occlusion and/or severe stenosis. 4. Unchanged right MCA M2 segment and left ACA A2 segment severe stenoses. 5. Approximately 50% stenosis both proximal internal carotid arteries, unchanged. Electronically Signed   By: Deatra Robinson M.D.   On: 07/04/2018 00:14        Scheduled Meds:  aspirin EC  81 mg Oral Daily   atorvastatin  80 mg Oral Q2000   clopidogrel  75 mg Oral Daily   enoxaparin (LOVENOX) injection  40 mg Subcutaneous Q24H   feeding supplement (ENSURE ENLIVE)  237 mL Oral BID BM   mirtazapine  15 mg Oral QHS   sodium chloride flush  3 mL Intravenous Once   sodium chloride flush  3 mL Intravenous Q12H   Continuous Infusions:  sodium chloride     dextrose 5 % and 0.9% NaCl 75 mL/hr at 07/05/18 1203     LOS: 1 day    Time spent: 25 minutes spent in the coordination of care today.    Teddy Spike, DO [facility] Pager [phone]  If 7PM-7AM, please contact night-coverage [email or web address] Password [email or web address] 07/05/2018, 4:01 PM

## 2018-07-06 LAB — RENAL FUNCTION PANEL
Albumin: 2.5 g/dL — ABNORMAL LOW (ref 3.5–5.0)
Albumin: 2.4 g/dL — ABNORMAL LOW (ref 3.5–5.0)
Anion gap: 8 (ref 5–15)
Anion gap: 10 (ref 5–15)
BUN: 13 mg/dL (ref 8–23)
BUN: 10 mg/dL (ref 8–23)
CO2: 26 mmol/L (ref 22–32)
CO2: 24 mmol/L (ref 22–32)
Calcium: 8.6 mg/dL — ABNORMAL LOW (ref 8.9–10.3)
Calcium: 8.7 mg/dL — ABNORMAL LOW (ref 8.9–10.3)
Chloride: 104 mmol/L (ref 98–111)
Chloride: 107 mmol/L (ref 98–111)
Creatinine, Ser: 0.73 mg/dL (ref 0.44–1.00)
Creatinine, Ser: 0.55 mg/dL (ref 0.44–1.00)
GFR calc Af Amer: >60 mL/min (ref >60)
GFR calc Af Amer: >60 mL/min (ref >60)
GFR calc non Af Amer: >60 mL/min (ref >60)
GFR calc non Af Amer: >60 mL/min (ref >60)
Glucose, Bld: 169 mg/dL — ABNORMAL HIGH (ref 70–99)
Glucose, Bld: 123 mg/dL — ABNORMAL HIGH (ref 70–99)
Phosphorus: 1.9 mg/dL — ABNORMAL LOW (ref 2.5–4.6)
Phosphorus: 1.8 mg/dL — ABNORMAL LOW (ref 2.5–4.6)
Potassium: 2.9 mmol/L — ABNORMAL LOW (ref 3.5–5.1)
Potassium: 2.9 mmol/L — ABNORMAL LOW (ref 3.5–5.1)
Sodium: 138 mmol/L (ref 135–145)
Sodium: 141 mmol/L (ref 135–145)

## 2018-07-06 LAB — CBC WITH DIFFERENTIAL/PLATELET
Abs Immature Granulocytes: 0.03 10*3/uL (ref 0.00–0.07)
Basophils Absolute: 0 10*3/uL (ref 0.0–0.1)
Basophils Relative: 1 %
Eosinophils Absolute: 0.2 10*3/uL (ref 0.0–0.5)
Eosinophils Relative: 2 %
HCT: 35 % — ABNORMAL LOW (ref 36.0–46.0)
Hemoglobin: 11.3 g/dL — ABNORMAL LOW (ref 12.0–15.0)
Immature Granulocytes: 0 %
Lymphocytes Relative: 19 %
Lymphs Abs: 1.7 10*3/uL (ref 0.7–4.0)
MCH: 29.4 pg (ref 26.0–34.0)
MCHC: 32.3 g/dL (ref 30.0–36.0)
MCV: 90.9 fL (ref 80.0–100.0)
Monocytes Absolute: 0.8 10*3/uL (ref 0.1–1.0)
Monocytes Relative: 9 %
Neutro Abs: 6.1 10*3/uL (ref 1.7–7.7)
Neutrophils Relative %: 69 %
Platelets: 199 10*3/uL (ref 150–400)
RBC: 3.85 MIL/uL — ABNORMAL LOW (ref 3.87–5.11)
RDW: 13.5 % (ref 11.5–15.5)
WBC: 8.8 10*3/uL (ref 4.0–10.5)
nRBC: 0 % (ref 0.0–0.2)

## 2018-07-06 LAB — MAGNESIUM: Magnesium: 1.7 mg/dL (ref 1.7–2.4)

## 2018-07-06 MED ORDER — MELATONIN 5 MG PO TABS
5.0000 mg | ORAL_TABLET | Freq: Every day | ORAL | Status: DC
  Filled 2018-07-06: qty 1

## 2018-07-06 MED ORDER — POTASSIUM CHLORIDE CRYS ER 20 MEQ PO TBCR: 20.0000 meq | EXTENDED_RELEASE_TABLET | Freq: Two times a day (BID) | ORAL | Status: DC

## 2018-07-06 MED ORDER — MELATONIN 3 MG PO TABS
6.0000 mg | ORAL_TABLET | Freq: Every day | ORAL | Status: DC
  Administered 2018-07-06 – 2018-07-08 (×3): 6 mg via ORAL
  Filled 2018-07-06 (×4): qty 2

## 2018-07-06 MED ORDER — POTASSIUM CHLORIDE CRYS ER 10 MEQ PO TBCR
10.0000 meq | EXTENDED_RELEASE_TABLET | Freq: Two times a day (BID) | ORAL | Status: DC
  Administered 2018-07-06: 10 meq via ORAL
  Filled 2018-07-06: qty 1

## 2018-07-06 MED ORDER — POTASSIUM PHOSPHATES 15 MMOLE/5ML IV SOLN
20.0000 meq | Freq: Once | INTRAVENOUS | Status: AC
  Administered 2018-07-06: 20 meq via INTRAVENOUS
  Filled 2018-07-06: qty 4.55

## 2018-07-06 NOTE — Progress Notes (Signed)
SLP Cancellation Note  Patient Details Name: Alexandra Mckee MRN: [id number] DOB: [birth year]   Cancelled treatment:       Reason Eval/Treat Not Completed: Patient at procedure or test/unavailable. Pt currently eating dinner and requested that cognitive-linguistic treatment be deferred. SLP will follow up on a subsequent date.   Dekisha Mesmer I. Vear Clock, MS, CCC-SLP Acute Rehabilitation Services Office number [phone] Pager [phone]  Scheryl Marten 07/06/2018, 5:33 PM

## 2018-07-06 NOTE — Progress Notes (Signed)
[name] Kitchen.  PROGRESS NOTE    Alexandra Mckee  ZOX:[id number] DOB: [birth year] DOA: 07/03/2018 PCP: Tally JoeSwayne, David, MD   Brief Narrative:   Alexandra GooLois W Mckee a 85 y.o.femalewith medical history significant ofhypertension, hyperlipidemia, previous stroke who presents with a right facial droop. She was last seen normal at the nursing home at 8:30 in the morning. Approximately of 11 AM, physical therapy came in to work with her and noticed that patient had a right facial droop and was more confused. EMS was called and patient was brought in as a code stroke. She denies any complaints on my examination. She denies any fevers, chills, chest pain, cough, shortness of breath, nausea, vomiting, diarrhea or abdominal pain   Assessment & Plan:   Principal Problem:   TIA (transient ischemic attack) Active Problems:   Essential hypertension   TIA - MRI Brain: IMPRESSION: 1. No acute intracranial abnormality. 2. Chronic ischemic microangiopathy and generalized volume loss. 3. Unchanged multifocal posterior cerebral artery short segment occlusion and/or severe stenosis. 4. Unchanged right MCA M2 segment and left ACA A2 segment severe stenoses. 5. Approximately 50% stenosis both proximal internal carotid arteries, unchanged. - she has persistent facial droop and some LUE weakness that is asymmetrical.  - SLP cognition complete; needs SNF  - she is not safe for discharge to ALF at this point, will need SNF  HTN - HCTZ; BP ok  Hypokalemia/Hypophosphatemia     - replete, recheck labs   DVT prophylaxis: lovenox Code Status: DNR Family Communication: None   Disposition Plan: To SNF   Consultants:   Neurology   Subjective: Much more alert and cooperative this morning.  Objective: Vitals:   07/06/18 0340 07/06/18 0902 07/06/18 1154 07/06/18 1608  BP: (!) 148/67 (!) 135/55 134/63 (!) 148/65  Pulse: 74 67 70 72  Resp: 16 15 18 16   Temp: 98.7 F (37.1 C) 98 F (36.7 C) 98.2 F  (36.8 C) 97.8 F (36.6 C)  TempSrc: Oral Oral  Oral  SpO2: 94% 98% 95% 98%  Weight:        Intake/Output Summary (Last 24 hours) at 07/06/2018 1718 Last data filed at 07/06/2018 1636 Gross per 24 hour  Intake 1604.21 ml  Output 200 ml  Net 1404.21 ml   Filed Weights   07/03/18 1200  Weight: 59.2 kg    Examination:  General:85 y.o.femaleresting in bed in NAD Cardiovascular: RRR, +S1, S2,2/6 SEM,no g/r, equal pulses throughout Respiratory: CTABL, no w/r/r, normal WOB GI: BS+, NDNT, no masses noted, no organomegaly noted MSK: No e/c/c Skin: No rashes, bruises, ulcerations noted Neuro:follows commands, nods yes/no, still with persistent facial droop, LUE weakness, and BLE weakness     Data Reviewed: I have personally reviewed following labs and imaging studies.  CBC: Recent Labs  Lab 07/03/18 1221 07/03/18 1241 07/04/18 0356 07/06/18 0350  WBC 8.5  --  7.8 8.8  NEUTROABS 5.4  --   --  6.1  HGB 12.9 12.6 12.8 11.3*  HCT 40.6 37.0 39.0 35.0*  MCV 93.1  --  90.7 90.9  PLT 254  --  228 199   Basic Metabolic Panel: Recent Labs  Lab 07/03/18 1221 07/03/18 1241 07/04/18 0356 07/06/18 0350 07/06/18 1450  NA 143 141 140 138 141  K 3.7 3.7 3.7 2.9* 2.9*  CL 106 104 102 104 107  CO2 29  --  26 26 24   GLUCOSE 118* 112* 95 169* 123*  BUN 15 19 14 13 10   CREATININE 0.86 0.70 0.88 0.73  0.55  CALCIUM 9.8  --  9.8 8.6* 8.7*  MG  --   --   --   --  1.7  PHOS  --   --   --  1.9* 1.8*   GFR: CrCl cannot be calculated (Unknown ideal weight.). Liver Function Tests: Recent Labs  Lab 07/03/18 1221 07/06/18 0350 07/06/18 1450  AST 53*  --   --   ALT 32  --   --   ALKPHOS 106  --   --   BILITOT 0.7  --   --   PROT 7.9  --   --   ALBUMIN 3.2* 2.5* 2.4*   No results for input(s): LIPASE, AMYLASE in the last 168 hours. No results for input(s): AMMONIA in the last 168 hours. Coagulation Profile: Recent Labs  Lab 07/03/18 1221  INR 1.0   Cardiac Enzymes:  No results for input(s): CKTOTAL, CKMB, CKMBINDEX, TROPONINI in the last 168 hours. BNP (last 3 results) No results for input(s): PROBNP in the last 8760 hours. HbA1C: Recent Labs    07/04/18 0356  HGBA1C 6.4*   CBG: Recent Labs  Lab 07/03/18 1221 07/03/18 1223  GLUCAP 107* 100*   Lipid Profile: Recent Labs    07/04/18 0356  CHOL 152  HDL 52  LDLCALC 79  TRIG 103  CHOLHDL 2.9   Thyroid Function Tests: No results for input(s): TSH, T4TOTAL, FREET4, T3FREE, THYROIDAB in the last 72 hours. Anemia Panel: No results for input(s): VITAMINB12, FOLATE, FERRITIN, TIBC, IRON, RETICCTPCT in the last 72 hours. Sepsis Labs: No results for input(s): PROCALCITON, LATICACIDVEN in the last 168 hours.  Recent Results (from the past 240 hour(s))  SARS Coronavirus 2 (CEPHEID - Performed in [facility] hospital lab), Hosp Order     Status: None   Collection Time: 07/03/18 12:49 PM  Result Value Ref Range Status   SARS Coronavirus 2 NEGATIVE NEGATIVE Final    Comment: (NOTE) If result is NEGATIVE SARS-CoV-2 target nucleic acids are NOT DETECTED. The SARS-CoV-2 RNA is generally detectable in upper and lower  respiratory specimens during the acute phase of infection. The lowest  concentration of SARS-CoV-2 viral copies this assay can detect is 250  copies / mL. A negative result does not preclude SARS-CoV-2 infection  and should not be used as the sole basis for treatment or other  patient management decisions.  A negative result may occur with  improper specimen collection / handling, submission of specimen other  than nasopharyngeal swab, presence of viral mutation(s) within the  areas targeted by this assay, and inadequate number of viral copies  (<250 copies / mL). A negative result must be combined with clinical  observations, patient history, and epidemiological information. If result is POSITIVE SARS-CoV-2 target nucleic acids are DETECTED. The SARS-CoV-2 RNA is generally  detectable in upper and lower  respiratory specimens dur ing the acute phase of infection.  Positive  results are indicative of active infection with SARS-CoV-2.  Clinical  correlation with patient history and other diagnostic information is  necessary to determine patient infection status.  Positive results do  not rule out bacterial infection or co-infection with other viruses. If result is PRESUMPTIVE POSTIVE SARS-CoV-2 nucleic acids MAY BE PRESENT.   A presumptive positive result was obtained on the submitted specimen  and confirmed on repeat testing.  While 2019 novel coronavirus  (SARS-CoV-2) nucleic acids may be present in the submitted sample  additional confirmatory testing may be necessary for epidemiological  and / or clinical  management purposes  to differentiate between  SARS-CoV-2 and other Sarbecovirus currently known to infect humans.  If clinically indicated additional testing with an alternate test  methodology [phone]) is advised. The SARS-CoV-2 RNA is generally  detectable in upper and lower respiratory sp ecimens during the acute  phase of infection. The expected result is Negative. Fact Sheet for Patients:  BoilerBrush.com.cy Fact Sheet for Healthcare Providers: https://pope.com/ This test is not yet approved or cleared by the [place] FDA and has been authorized for detection and/or diagnosis of SARS-CoV-2 by FDA under an Emergency Use Authorization (EUA).  This EUA will remain in effect (meaning this test can be used) for the duration of the COVID-19 declaration under Section 564(b)(1) of the Act, 21 U.S.C. section 360bbb-3(b)(1), unless the authorization is terminated or revoked sooner. Performed at [facility], [address] [place], [place] [address]          Radiology Studies: No results found.      Scheduled Meds: . aspirin EC  81 mg Oral Daily  . atorvastatin  80 mg Oral Q2000   . clopidogrel  75 mg Oral Daily  . enoxaparin (LOVENOX) injection  40 mg Subcutaneous Q24H  . feeding supplement (ENSURE ENLIVE)  237 mL Oral BID BM  . Melatonin  6 mg Oral QHS  . mirtazapine  15 mg Oral QHS  . potassium chloride  10 mEq Oral BID  . sodium chloride flush  3 mL Intravenous Once  . sodium chloride flush  3 mL Intravenous Q12H   Continuous Infusions: . sodium chloride    . dextrose 5 % and 0.9% NaCl 125 mL/hr at 07/06/18 1444     LOS: 2 days    Time spent: 25 minutes spent in the coordination of care today.    Teddy Spike, DO [facility] Pager [phone]  If 7PM-7AM, please contact night-coverage [email or web address] Password [facility] 07/06/2018, 5:18 PM

## 2018-07-06 NOTE — NC FL2 (Signed)
[place] MEDICAID FL2 LEVEL OF CARE SCREENING TOOL     IDENTIFICATION  Patient Name: Alexandra Mckee Birthdate: 1921-09-29 Sex: female Admission Date (Current Location): 07/03/2018  [facility] and [place] Number:  Producer, television/film/video and Address:  [facility], [address] [place], [place] [address]      Provider Number: [id number]  Attending Physician Name and Address:  Teddy Spike, DO  Relative Name and Phone Number:  Aneres, Muhl, [phone]     Current Level of Care: Hospital Recommended Level of Care: Assisted Living Facility Prior Approval Number:    Date Approved/Denied:   PASRR Number:    Discharge Plan:      Current Diagnoses: Patient Active Problem List   Diagnosis Date Noted  . TIA (transient ischemic attack) 07/03/2018  . Essential hypertension 07/03/2018  . Stroke (HCC) 06/01/2017  . Memory loss 08/28/2013    Orientation RESPIRATION BLADDER Height & Weight     Self  Normal Incontinent, External catheter Weight: 130 lb 8.2 oz (59.2 kg) Height:     BEHAVIORAL SYMPTOMS/MOOD NEUROLOGICAL BOWEL NUTRITION STATUS      Continent Diet(Heart Healthy diet, thin liquids)  AMBULATORY STATUS COMMUNICATION OF NEEDS Skin   Limited Assist(Limited assistance) Verbally Other (Comment)(Cellulitis )                       Personal Care Assistance Level of Assistance  Bathing, Feeding, Dressing, Total care Bathing Assistance: Maximum assistance(min upper/max 2+ lower) Feeding assistance: Independent(May need assistance setting up) Dressing Assistance: Limited assistance(mod upper/total +2 lower) Total Care Assistance: Limited assistance(Limited assistance)   Functional Limitations Info  Sight, Hearing, Speech Sight Info: Adequate Hearing Info: Adequate Speech Info: Adequate    SPECIAL CARE FACTORS FREQUENCY  PT (By licensed PT), OT (By licensed OT), Speech therapy     PT Frequency: 3x/wk OT Frequency: 3x/wk     Speech  Therapy Frequency: 2x/wk      Contractures Contractures Info: Not present    Additional Factors Info  Code Status, Allergies Code Status Info: DNR Allergies Info: No Known Allergies           Current Medications (07/06/2018):  This is the current hospital active medication list Current Facility-Administered Medications  Medication Dose Route Frequency Provider Last Rate Last Dose  . 0.9 %  sodium chloride infusion  250 mL Intravenous PRN Noralee Stain, DO      . acetaminophen (TYLENOL) tablet 650 mg  650 mg Oral Q6H PRN Noralee Stain, DO       Or  . acetaminophen (TYLENOL) suppository 650 mg  650 mg Rectal Q6H PRN Noralee Stain, DO      . aspirin EC tablet 81 mg  81 mg Oral Daily Micki Riley, MD   81 mg at 07/06/18 1031  . atorvastatin (LIPITOR) tablet 80 mg  80 mg Oral Q2000 Noralee Stain, DO   80 mg at 07/05/18 2022  . clopidogrel (PLAVIX) tablet 75 mg  75 mg Oral Daily Noralee Stain, DO   75 mg at 07/06/18 1031  . dextrose 5 %-0.9 % sodium chloride infusion   Intravenous Continuous Kyle, Tyrone A, DO 125 mL/hr at 07/06/18 1444    . enoxaparin (LOVENOX) injection 40 mg  40 mg Subcutaneous Q24H Noralee Stain, DO   40 mg at 07/05/18 1725  . feeding supplement (ENSURE ENLIVE) (ENSURE ENLIVE) liquid 237 mL  237 mL Oral BID BM Kyle, Tyrone A, DO   237  mL at 07/06/18 1440  . hydrALAZINE (APRESOLINE) injection 5 mg  5 mg Intravenous Q6H PRN Ronaldo MiyamotoKyle, Tyrone A, DO      . Melatonin TABS 6 mg  6 mg Oral QHS Scarlett Prestogan, Theresa D, [facility]      . mirtazapine (REMERON) tablet 15 mg  15 mg Oral QHS Noralee Stainhoi, Jennifer, DO   15 mg at 07/05/18 2027  . ondansetron (ZOFRAN) tablet 4 mg  4 mg Oral Q6H PRN Noralee Stainhoi, Jennifer, DO       Or  . ondansetron [facility](ZOFRAN) injection 4 mg  4 mg Intravenous Q6H PRN Noralee Stainhoi, Jennifer, DO      . potassium chloride (K-DUR) CR tablet 10 mEq  10 mEq Oral BID Ronaldo MiyamotoKyle, Tyrone A, DO   10 mEq at 07/06/18 1031  . senna-docusate (Senokot-S) tablet 1 tablet  1 tablet Oral QHS PRN Noralee Stainhoi,  Jennifer, DO   1 tablet at 07/04/18 2043  . sodium chloride flush (NS) 0.9 % injection 3 mL  3 mL Intravenous Once Noralee Stainhoi, Jennifer, DO      . sodium chloride flush (NS) 0.9 % injection 3 mL  3 mL Intravenous Q12H Noralee Stainhoi, Jennifer, DO   3 mL at 07/06/18 1031  . sodium chloride flush (NS) 0.9 % injection 3 mL  3 mL Intravenous PRN Noralee Stainhoi, Jennifer, DO         Discharge Medications: Please see discharge summary for a list of discharge medications.  Relevant Imaging Results:  Relevant Lab Results:   Additional Information SSN: [id number]  Nada BoozerCaitlin B Dinisha Cai, LCSWA

## 2018-07-07 LAB — CBC WITH DIFFERENTIAL/PLATELET
Abs Immature Granulocytes: 0.02 10*3/uL (ref 0.00–0.07)
Basophils Absolute: 0.1 10*3/uL (ref 0.0–0.1)
Basophils Relative: 1 %
Eosinophils Absolute: 0.3 10*3/uL (ref 0.0–0.5)
Eosinophils Relative: 4 %
HCT: 33.7 % — ABNORMAL LOW (ref 36.0–46.0)
Hemoglobin: 10.8 g/dL — ABNORMAL LOW (ref 12.0–15.0)
Immature Granulocytes: 0 %
Lymphocytes Relative: 28 %
Lymphs Abs: 1.9 10*3/uL (ref 0.7–4.0)
MCH: 29.4 pg (ref 26.0–34.0)
MCHC: 32 g/dL (ref 30.0–36.0)
MCV: 91.8 fL (ref 80.0–100.0)
Monocytes Absolute: 0.7 10*3/uL (ref 0.1–1.0)
Monocytes Relative: 10 %
Neutro Abs: 3.8 10*3/uL (ref 1.7–7.7)
Neutrophils Relative %: 57 %
Platelets: 181 10*3/uL (ref 150–400)
RBC: 3.67 MIL/uL — ABNORMAL LOW (ref 3.87–5.11)
RDW: 13.8 % (ref 11.5–15.5)
WBC: 6.7 10*3/uL (ref 4.0–10.5)
nRBC: 0 % (ref 0.0–0.2)

## 2018-07-07 LAB — RENAL FUNCTION PANEL
Albumin: 2.3 g/dL — ABNORMAL LOW (ref 3.5–5.0)
Anion gap: 12 (ref 5–15)
BUN: 9 mg/dL (ref 8–23)
CO2: 21 mmol/L — ABNORMAL LOW (ref 22–32)
Calcium: 8.4 mg/dL — ABNORMAL LOW (ref 8.9–10.3)
Chloride: 105 mmol/L (ref 98–111)
Creatinine, Ser: 0.61 mg/dL (ref 0.44–1.00)
GFR calc Af Amer: >60 mL/min (ref >60)
GFR calc non Af Amer: >60 mL/min (ref >60)
Glucose, Bld: 138 mg/dL — ABNORMAL HIGH (ref 70–99)
Phosphorus: 2.4 mg/dL — ABNORMAL LOW (ref 2.5–4.6)
Potassium: 3 mmol/L — ABNORMAL LOW (ref 3.5–5.1)
Sodium: 138 mmol/L (ref 135–145)

## 2018-07-07 LAB — MAGNESIUM: Magnesium: 1.6 mg/dL — ABNORMAL LOW (ref 1.7–2.4)

## 2018-07-07 NOTE — Plan of Care (Signed)
progressing 

## 2018-07-07 NOTE — Progress Notes (Signed)
Patient states she will not take any of her morning medications

## 2018-07-07 NOTE — Progress Notes (Signed)
[name] Kitchen.  PROGRESS NOTE    Alexandra Mckee  YQM:[id number] DOB: 07-Aug-1921 DOA: 07/03/2018 PCP: Tally JoeSwayne, David, MD   Brief Narrative:   Alexandra Mckee a 85 y.o.femalewith medical history significant ofhypertension, hyperlipidemia, previous stroke who presents with a right facial droop. She was last seen normal at the nursing home at 8:30 in the morning. Approximately of 11 AM, physical therapy came in to work with her and noticed that patient had a right facial droop and was more confused. EMS was called and patient was brought in as a code stroke. She denies any complaints on my examination. She denies any fevers, chills, chest pain, cough, shortness of breath, nausea, vomiting, diarrhea or abdominal pain   Assessment & Plan:   Principal Problem:   TIA (transient ischemic attack) Active Problems:   Essential hypertension   TIA - MRI Brain: IMPRESSION: 1. No acute intracranial abnormality. 2. Chronic ischemic microangiopathy and generalized volume loss. 3. Unchanged multifocal posterior cerebral artery short segment occlusion and/or severe stenosis. 4. Unchanged right MCA M2 segment and left ACA A2 segment severe stenoses. 5. Approximately 50% stenosis both proximal internal carotid arteries, unchanged. - she has persistent facial droop and some LUE weakness that is asymmetrical.  - SLP cognitioncomplete; needs SNF - [facility]  HTN - HCTZ; BP ok  Hypokalemia/Hypophosphatemia     - replete, recheck labs   DVT prophylaxis: lovenox Code Status: DNR Family Communication: Spoke with son on phone this AM   Disposition Plan: To ALF w/ HH   Consultants:   Neurology   Subjective: Intermittently refusing to take meds. Otherwise, no acute events ON.   Objective: Vitals:   07/07/18 0024 07/07/18 0405 07/07/18 0914 07/07/18 1104  BP: (!) 141/61 (!) 154/71 (!) 148/65 (!) 152/77  Pulse: 75 71 72 70  Resp:  18 18 20 18   Temp: 98.2 F (36.8 C) 98.1 F (36.7 C) (!) 97.5 F (36.4 C) 97.7 F (36.5 C)  TempSrc: Oral Oral Oral Oral  SpO2:  96% 98% 97%  Weight:        Intake/Output Summary (Last 24 hours) at 07/07/2018 1404 Last data filed at 07/07/2018 0300 Gross per 24 hour  Intake 1146.44 ml  Output 200 ml  Net 946.44 ml   Filed Weights   07/03/18 1200  Weight: 59.2 kg    Examination:  General:85 y.o.femaleresting in bed in NAD Cardiovascular: RRR, +S1, S2,2/6 SEM,no g/r, equal pulses throughout Respiratory: CTABL, no w/r/r, normal WOB GI: BS+, NDNT, no masses noted, no organomegaly noted MSK: No e/c/c Skin: No rashes, bruises, ulcerations noted Neuro:follows commands, nods yes/no, still with persistent facial droop, LUE weakness, and BLE weakness     Data Reviewed: I have personally reviewed following labs and imaging studies.  CBC: Recent Labs  Lab 07/03/18 1221 07/03/18 1241 07/04/18 0356 07/06/18 0350 07/07/18 0402  WBC 8.5  --  7.8 8.8 6.7  NEUTROABS 5.4  --   --  6.1 3.8  HGB 12.9 12.6 12.8 11.3* 10.8*  HCT 40.6 37.0 39.0 35.0* 33.7*  MCV 93.1  --  90.7 90.9 91.8  PLT 254  --  228 199 181   Basic Metabolic Panel: Recent Labs  Lab 07/03/18 1221 07/03/18 1241 07/04/18 0356 07/06/18 0350 07/06/18 1450 07/07/18 0402  NA 143 141 140 138 141 138  K 3.7 3.7 3.7 2.9* 2.9* 3.0*  CL 106 104 102 104 107 105  CO2 29  --  21*  GLUCOSE 118* 112* 95 169* 123* 138*  BUN CREATININE 0.86 0.70 0.88 0.73 0.55 0.61  CALCIUM 9.8  --  9.8 8.6* 8.7* 8.4*  MG  --   --   --   --  1.7 1.6*  PHOS  --   --   --  1.9* 1.8* 2.4*   GFR: CrCl cannot be calculated (Unknown ideal weight.). Liver Function Tests: Recent Labs  Lab 07/03/18 1221 07/06/18 0350 07/06/18 1450 07/07/18 0402  AST 53*  --   --   --   ALT 32  --   --   --   ALKPHOS 106  --   --   --   BILITOT 0.7  --   --   --   PROT 7.9  --   --   --   ALBUMIN 3.2* 2.5* 2.4*  2.3*   No results for input(s): LIPASE, AMYLASE in the last 168 hours. No results for input(s): AMMONIA in the last 168 hours. Coagulation Profile: Recent Labs  Lab 07/03/18 1221  INR 1.0   Cardiac Enzymes: No results for input(s): CKTOTAL, CKMB, CKMBINDEX, TROPONINI in the last 168 hours. BNP (last 3 results) No results for input(s): PROBNP in the last 8760 hours. HbA1C: No results for input(s): HGBA1C in the last 72 hours. CBG: Recent Labs  Lab 07/03/18 1221 07/03/18 1223  GLUCAP 107* 100*   Lipid Profile: No results for input(s): CHOL, HDL, LDLCALC, TRIG, CHOLHDL, LDLDIRECT in the last 72 hours. Thyroid Function Tests: No results for input(s): TSH, T4TOTAL, FREET4, T3FREE, THYROIDAB in the last 72 hours. Anemia Panel: No results for input(s): VITAMINB12, FOLATE, FERRITIN, TIBC, IRON, RETICCTPCT in the last 72 hours. Sepsis Labs: No results for input(s): PROCALCITON, LATICACIDVEN in the last 168 hours.  Recent Results (from the past 240 hour(s))  SARS Coronavirus 2 (CEPHEID - Performed in [facility] hospital lab), Hosp Order     Status: None   Collection Time: 07/03/18 12:49 PM  Result Value Ref Range Status   SARS Coronavirus 2 NEGATIVE NEGATIVE Final    Comment: (NOTE) If result is NEGATIVE SARS-CoV-2 target nucleic acids are NOT DETECTED. The SARS-CoV-2 RNA is generally detectable in upper and lower  respiratory specimens during the acute phase of infection. The lowest  concentration of SARS-CoV-2 viral copies this assay can detect is 250  copies / mL. A negative result does not preclude SARS-CoV-2 infection  and should not be used as the sole basis for treatment or other  patient management decisions.  A negative result may occur with  improper specimen collection / handling, submission of specimen other  than nasopharyngeal swab, presence of viral mutation(s) within the  areas targeted by this assay, and inadequate number of viral copies  (<250 copies / mL). A  negative result must be combined with clinical  observations, patient history, and epidemiological information. If result is POSITIVE SARS-CoV-2 target nucleic acids are DETECTED. The SARS-CoV-2 RNA is generally detectable in upper and lower  respiratory specimens dur ing the acute phase of infection.  Positive  results are indicative of active infection with SARS-CoV-2.  Clinical  correlation with patient history and other diagnostic information is  necessary to determine patient infection status.  Positive results do  not rule out bacterial infection or co-infection with other viruses. If result is PRESUMPTIVE POSTIVE SARS-CoV-2 nucleic acids MAY BE PRESENT.   A presumptive positive result was obtained on the submitted  specimen  and confirmed on repeat testing.  While 2019 novel coronavirus  (SARS-CoV-2) nucleic acids may be present in the submitted sample  additional confirmatory testing may be necessary for epidemiological  and / or clinical management purposes  to differentiate between  SARS-CoV-2 and other Sarbecovirus currently known to infect humans.  If clinically indicated additional testing with an alternate test  methodology [id number] is advised. The SARS-CoV-2 RNA is generally  detectable in upper and lower respiratory sp ecimens during the acute  phase of infection. The expected result is Negative. Fact Sheet for Patients:  BoilerBrush.com.cy Fact Sheet for Healthcare Providers: https://pope.com/ This test is not yet approved or cleared by the [place] FDA and has been authorized for detection and/or diagnosis of SARS-CoV-2 by FDA under an Emergency Use Authorization (EUA).  This EUA will remain in effect (meaning this test can be used) for the duration of the COVID-19 declaration under Section 564(b)(1) of the Act, 21 U.S.C. section 360bbb-3(b)(1), unless the authorization is terminated or revoked sooner. Performed  at [facility], [address] [place], [place] [address]          Radiology Studies: No results found.      Scheduled Meds: . aspirin EC  81 mg Oral Daily  . atorvastatin  80 mg Oral Q2000  . clopidogrel  75 mg Oral Daily  . enoxaparin (LOVENOX) injection  40 mg Subcutaneous Q24H  . feeding supplement (ENSURE ENLIVE)  237 mL Oral BID BM  . Melatonin  6 mg Oral QHS  . mirtazapine  15 mg Oral QHS  . sodium chloride flush  3 mL Intravenous Once  . sodium chloride flush  3 mL Intravenous Q12H   Continuous Infusions: . sodium chloride    . dextrose 5 % and 0.9% NaCl 125 mL/hr at 07/07/18 1351     LOS: 3 days    Time spent: 20 minutes spent in the coordination of care today.    Teddy Spike, DO [facility] Pager [phone]  If 7PM-7AM, please contact night-coverage [email or web address] Password [facility] 07/07/2018, 2:04 PM

## 2018-07-08 LAB — RENAL FUNCTION PANEL
Albumin: 2.1 g/dL — ABNORMAL LOW (ref 3.5–5.0)
Anion gap: 11 (ref 5–15)
BUN: 5 mg/dL — ABNORMAL LOW (ref 8–23)
CO2: 23 mmol/L (ref 22–32)
Calcium: 8.4 mg/dL — ABNORMAL LOW (ref 8.9–10.3)
Chloride: 105 mmol/L (ref 98–111)
Creatinine, Ser: 0.54 mg/dL (ref 0.44–1.00)
GFR calc Af Amer: >60 mL/min (ref >60)
GFR calc non Af Amer: >60 mL/min (ref >60)
Glucose, Bld: 140 mg/dL — ABNORMAL HIGH (ref 70–99)
Phosphorus: 2.3 mg/dL — ABNORMAL LOW (ref 2.5–4.6)
Potassium: 2.7 mmol/L — CL (ref 3.5–5.1)
Sodium: 139 mmol/L (ref 135–145)

## 2018-07-08 MED ORDER — POTASSIUM CHLORIDE 20 MEQ PO PACK
20.0000 meq | PACK | Freq: Two times a day (BID) | ORAL | Status: DC
  Administered 2018-07-08 – 2018-07-09 (×3): 20 meq via ORAL
  Filled 2018-07-08 (×3): qty 1

## 2018-07-08 NOTE — Progress Notes (Signed)
[name] Kitchen.  PROGRESS NOTE    Alexandra Mckee  ZOX:[id number] DOB: Mar 26, 1921 DOA: 07/03/2018 PCP: Alexandra JoeSwayne, David, MD   Brief Narrative:   Alexandra GooLois W Mckee a 85 y.o.femalewith medical history significant ofhypertension, hyperlipidemia, previous stroke who presents with a right facial droop. She was last seen normal at the nursing home at 8:30 in the morning. Approximately of 11 AM, physical therapy came in to work with her and noticed that patient had a right facial droop and was more confused. EMS was called and patient was brought in as a code stroke. She denies any complaints on my examination. She denies any fevers, chills, chest pain, cough, shortness of breath, nausea, vomiting, diarrhea or abdominal pain   Assessment & Plan:   Principal Problem:   TIA (transient ischemic attack) Active Problems:   Essential hypertension   TIA - MRI Brain: IMPRESSION: 1. No acute intracranial abnormality. 2. Chronic ischemic microangiopathy and generalized volume loss. 3. Unchanged multifocal posterior cerebral artery short segment occlusion and/or severe stenosis. 4. Unchanged right MCA M2 segment and left ACA A2 segment severe stenoses. 5. Approximately 50% stenosis both proximal internal carotid arteries, unchanged. - she has persistent facial droop and some LUE weakness that is asymmetrical.  - SLP cognitioncomplete; needs SNF - [facility] Monday  HTN - HCTZ; BP ok  Hypokalemia/Hypophosphatemia - replete     - K+ is still low despite IV replacement, she doesn't want to take much by mouth, I will order K+ PO though. PO4- is improved   DVT prophylaxis: lovenox Code Status: DNR Family Communication: none today   Disposition Plan: To ALF w/ HH   Consultants:   Neurology   Subjective: Says she doesn't want to eat.  Objective: Vitals:   07/08/18 0402 07/08/18 0820 07/08/18 0822 07/08/18 1256  BP: (!) 115/48 (!)  123/100 95/60 (!) 153/62  Pulse: 80 72 76 77  Resp: 17 15 15 20   Temp: 98.2 F (36.8 C) 97.7 F (36.5 C) 97.7 F (36.5 C) 98.5 F (36.9 C)  TempSrc: Oral Oral Oral Oral  SpO2: 96%  96% 93%  Weight:        Intake/Output Summary (Last 24 hours) at 07/08/2018 1325 Last data filed at 07/07/2018 2300 Gross per 24 hour  Intake 80 ml  Output 2500 ml  Net -2420 ml   Filed Weights   07/03/18 1200  Weight: 59.2 kg    Examination:  General:85 y.o.femaleresting in bed in NAD Cardiovascular: RRR, +S1, S2,2/6 SEM,no g/r, equal pulses throughout Respiratory: CTABL, no w/r/r, normal WOB GI: BS+, NDNT, no masses noted, no organomegaly noted MSK: No e/c/c Skin: No rashes, bruises, ulcerations noted Neuro:follows commands, answers some questionm still with persistent facial droop, LUE weakness, and BLE weakness    Data Reviewed: I have personally reviewed following labs and imaging studies.  CBC: Recent Labs  Lab 07/03/18 1221 07/03/18 1241 07/04/18 0356 07/06/18 0350 07/07/18 0402  WBC 8.5  --  7.8 8.8 6.7  NEUTROABS 5.4  --   --  6.1 3.8  HGB 12.9 12.6 12.8 11.3* 10.8*  HCT 40.6 37.0 39.0 35.0* 33.7*  MCV 93.1  --  90.7 90.9 91.8  PLT 254  --  228 199 181   Basic Metabolic Panel: Recent Labs  Lab 07/04/18 0356 07/06/18 0350 07/06/18 1450 07/07/18 0402 07/08/18 1219  NA 140 138 141 138 139  K 3.7 2.9* 2.9* 3.0* 2.7*  CL 102 104 107  105 105  CO2 21* 23  GLUCOSE 95 169* 123* 138* 140*  BUN 5*  CREATININE 0.88 0.73 0.55 0.61 0.54  CALCIUM 9.8 8.6* 8.7* 8.4* 8.4*  MG  --   --  1.7 1.6*  --   PHOS  --  1.9* 1.8* 2.4* 2.3*   GFR: CrCl cannot be calculated (Unknown ideal weight.). Liver Function Tests: Recent Labs  Lab 07/03/18 1221 07/06/18 0350 07/06/18 1450 07/07/18 0402 07/08/18 1219  AST 53*  --   --   --   --   ALT 32  --   --   --   --   ALKPHOS 106  --   --   --   --   BILITOT 0.7  --   --   --   --   PROT 7.9  --   --    --   --   ALBUMIN 3.2* 2.5* 2.4* 2.3* 2.1*   No results for input(s): LIPASE, AMYLASE in the last 168 hours. No results for input(s): AMMONIA in the last 168 hours. Coagulation Profile: Recent Labs  Lab 07/03/18 1221  INR 1.0   Cardiac Enzymes: No results for input(s): CKTOTAL, CKMB, CKMBINDEX, TROPONINI in the last 168 hours. BNP (last 3 results) No results for input(s): PROBNP in the last 8760 hours. HbA1C: No results for input(s): HGBA1C in the last 72 hours. CBG: Recent Labs  Lab 07/03/18 1221 07/03/18 1223  GLUCAP 107* 100*   Lipid Profile: No results for input(s): CHOL, HDL, LDLCALC, TRIG, CHOLHDL, LDLDIRECT in the last 72 hours. Thyroid Function Tests: No results for input(s): TSH, T4TOTAL, FREET4, T3FREE, THYROIDAB in the last 72 hours. Anemia Panel: No results for input(s): VITAMINB12, FOLATE, FERRITIN, TIBC, IRON, RETICCTPCT in the last 72 hours. Sepsis Labs: No results for input(s): PROCALCITON, LATICACIDVEN in the last 168 hours.  Recent Results (from the past 240 hour(s))  SARS Coronavirus 2 (CEPHEID - Performed in [facility] hospital lab), Hosp Order     Status: None   Collection Time: 07/03/18 12:49 PM  Result Value Ref Range Status   SARS Coronavirus 2 NEGATIVE NEGATIVE Final    Comment: (NOTE) If result is NEGATIVE SARS-CoV-2 target nucleic acids are NOT DETECTED. The SARS-CoV-2 RNA is generally detectable in upper and lower  respiratory specimens during the acute phase of infection. The lowest  concentration of SARS-CoV-2 viral copies this assay can detect is 250  copies / mL. A negative result does not preclude SARS-CoV-2 infection  and should not be used as the sole basis for treatment or other  patient management decisions.  A negative result may occur with  improper specimen collection / handling, submission of specimen other  than nasopharyngeal swab, presence of viral mutation(s) within the  areas targeted by this assay, and inadequate number  of viral copies  (<250 copies / mL). A negative result must be combined with clinical  observations, patient history, and epidemiological information. If result is POSITIVE SARS-CoV-2 target nucleic acids are DETECTED. The SARS-CoV-2 RNA is generally detectable in upper and lower  respiratory specimens dur ing the acute phase of infection.  Positive  results are indicative of active infection with SARS-CoV-2.  Clinical  correlation with patient history and other diagnostic information is  necessary to determine patient infection status.  Positive results do  not rule out bacterial infection or co-infection with other viruses. If result is PRESUMPTIVE POSTIVE SARS-CoV-2 nucleic acids MAY BE PRESENT.  A presumptive positive result was obtained on the submitted specimen  and confirmed on repeat testing.  While 2019 novel coronavirus  (SARS-CoV-2) nucleic acids may be present in the submitted sample  additional confirmatory testing may be necessary for epidemiological  and / or clinical management purposes  to differentiate between  SARS-CoV-2 and other Sarbecovirus currently known to infect humans.  If clinically indicated additional testing with an alternate test  methodology [phone]) is advised. The SARS-CoV-2 RNA is generally  detectable in upper and lower respiratory sp ecimens during the acute  phase of infection. The expected result is Negative. Fact Sheet for Patients:  BoilerBrush.com.cy Fact Sheet for Healthcare Providers: https://pope.com/ This test is not yet approved or cleared by the [place] FDA and has been authorized for detection and/or diagnosis of SARS-CoV-2 by FDA under an Emergency Use Authorization (EUA).  This EUA will remain in effect (meaning this test can be used) for the duration of the COVID-19 declaration under Section 564(b)(1) of the Act, 21 U.S.C. section 360bbb-3(b)(1), unless the authorization is  terminated or revoked sooner. Performed at [facility], [address], [place], [place] [address]          Radiology Studies: No results found.      Scheduled Meds: . aspirin EC  81 mg Oral Daily  . atorvastatin  80 mg Oral Q2000  . clopidogrel  75 mg Oral Daily  . enoxaparin (LOVENOX) injection  40 mg Subcutaneous Q24H  . feeding supplement (ENSURE ENLIVE)  237 mL Oral BID BM  . Melatonin  6 mg Oral QHS  . mirtazapine  15 mg Oral QHS  . potassium chloride  20 mEq Oral BID  . sodium chloride flush  3 mL Intravenous Once  . sodium chloride flush  3 mL Intravenous Q12H   Continuous Infusions: . sodium chloride    . dextrose 5 % and 0.9% NaCl 125 mL/hr at 07/07/18 2322     LOS: 4 days    Time spent: 25 minutes spent in the coordination of care today.    Teddy Spike, DO [facility] Pager [phone]  If 7PM-7AM, please contact night-coverage [email or web address] Password [email or web address] 07/08/2018, 1:25 PM

## 2018-07-08 NOTE — Progress Notes (Signed)
MD aware of K+ level 2.7. new orders placed

## 2018-07-09 LAB — RENAL FUNCTION PANEL
Albumin: 2.2 g/dL — ABNORMAL LOW (ref 3.5–5.0)
Anion gap: 10 (ref 5–15)
BUN: <5 mg/dL — ABNORMAL LOW (ref 8–23)
CO2: 25 mmol/L (ref 22–32)
Calcium: 8.6 mg/dL — ABNORMAL LOW (ref 8.9–10.3)
Chloride: 102 mmol/L (ref 98–111)
Creatinine, Ser: 0.55 mg/dL (ref 0.44–1.00)
GFR calc Af Amer: >60 mL/min (ref >60)
GFR calc non Af Amer: >60 mL/min (ref >60)
Glucose, Bld: 139 mg/dL — ABNORMAL HIGH (ref 70–99)
Phosphorus: 2.5 mg/dL (ref 2.5–4.6)
Potassium: 3.5 mmol/L (ref 3.5–5.1)
Sodium: 137 mmol/L (ref 135–145)

## 2018-07-09 LAB — GLUCOSE, CAPILLARY: Glucose-Capillary: 138 mg/dL — ABNORMAL HIGH (ref 70–99)

## 2018-07-09 LAB — MAGNESIUM: Magnesium: 1.4 mg/dL — ABNORMAL LOW (ref 1.7–2.4)

## 2018-07-09 MED ORDER — MAGNESIUM OXIDE 400 MG PO TABS: 400.0000 mg | ORAL_TABLET | Freq: Every day | ORAL | 0 refills | Status: AC

## 2018-07-09 MED ORDER — ASPIRIN 81 MG PO TBEC: 81.0000 mg | DELAYED_RELEASE_TABLET | Freq: Every day | ORAL | 0 refills | Status: AC

## 2018-07-09 NOTE — Progress Notes (Signed)
Report called to [place], LPN at [facility] to resume care of patient. Patient VSS, NAD noted.

## 2018-07-09 NOTE — Progress Notes (Signed)
Physical Therapy Treatment Patient Details Name: Alexandra Mckee MRN: [id number] DOB: 27-Jul-1921 Today's Date: 07/09/2018    History of Present Illness Pt is a 85 y/o female presenting with R facial droop, confusion from SNF. CT negative.  Further workup.  PMH: HTN, CVA. MRI scan shows no acute infarct.  MRA shows 50% proximal right ICA stenosis and severe intracranial right M2 and right P2 stenosis.    PT Comments    Pt with slow progression towards goals. Pt very resistive to mobility when sitting EOB and requiring max A to maintain sitting balance. Returned to supine for clean up following BM. Total A +2 for turning from side to side. Current recommendations appropriate. Will continue to follow acutely to maximize functional mobility independence and safety.    Follow Up Recommendations  SNF     Equipment Recommendations  None recommended by PT    Recommendations for Other Services       Precautions / Restrictions Precautions Precautions: Fall Restrictions Weight Bearing Restrictions: No    Mobility  Bed Mobility Overal bed mobility: Needs Assistance Bed Mobility: Rolling;Supine to Sit;Sit to Supine Rolling: Total assist;+2 for physical assistance   Supine to sit: Max assist Sit to supine: Max assist   General bed mobility comments: Attempted to sit EOB, however, pt very resistive once sitting and with heavy R lateral lean, therefore, returned to supine. Had to roll side to side for clean up following BM. Required total A +2 to roll from side to side.   Transfers                 General transfer comment: Pt refusing.   Ambulation/Gait                 Stairs             Wheelchair Mobility    Modified Rankin (Stroke Patients Only)       Balance Overall balance assessment: Needs assistance Sitting-balance support: Bilateral upper extremity supported;Feet supported Sitting balance-Leahy Scale: Poor Sitting balance - Comments: Required max A  to maintain sitting balance as pt very resisitive to mobility. R lateral lean noted.                                     Cognition Arousal/Alertness: Awake/alert Behavior During Therapy: Flat affect Overall Cognitive Status: No family/caregiver present to determine baseline cognitive functioning Area of Impairment: Attention;Memory;Following commands;Safety/judgement;Awareness;Problem solving                   Current Attention Level: Focused Memory: Decreased recall of precautions;Decreased short-term memory Following Commands: Follows one step commands with increased time;Follows one step commands consistently Safety/Judgement: Decreased awareness of safety;Decreased awareness of deficits Awareness: Intellectual Problem Solving: Difficulty sequencing;Requires tactile cues;Slow processing;Decreased initiation;Requires verbal cues        Exercises      General Comments General comments (skin integrity, edema, etc.): Noted blister on back of R thigh. RN notified.       Pertinent Vitals/Pain Pain Assessment: No/denies pain    Home Living                      Prior Function            PT Goals (current goals can now be found in the care plan section) Acute Rehab PT Goals Patient Stated Goal: to lay back down  PT Goal  Formulation: Patient unable to participate in goal setting Time For Goal Achievement: 07/18/18 Potential to Achieve Goals: Good Progress towards PT goals: Progressing toward goals    Frequency    Min 3X/week      PT Plan Current plan remains appropriate    Co-evaluation              AM-PAC PT "6 Clicks" Mobility   Outcome Measure  Help needed turning from your back to your side while in a flat bed without using bedrails?: Total Help needed moving from lying on your back to sitting on the side of a flat bed without using bedrails?: Total Help needed moving to and from a bed to a chair (including a wheelchair)?:  Total Help needed standing up from a chair using your arms (e.g., wheelchair or bedside chair)?: Total Help needed to walk in hospital room?: Total Help needed climbing 3-5 steps with a railing? : Total 6 Click Score: 6    End of Session   Activity Tolerance: Patient limited by fatigue Patient left: in bed;with call bell/phone within reach;with bed alarm set Nurse Communication: Mobility status;Other (comment)(suction on purwick not working; blister on R thigh) PT Visit Diagnosis: Muscle weakness (generalized) (M62.81);Difficulty in walking, not elsewhere classified (R26.2);Other symptoms and signs involving the nervous system (R29.898)     Time: 1430-1455 PT Time Calculation (min) (ACUTE ONLY): 25 min  Charges:  $Therapeutic Activity: 23-37 mins                     Gladys DammeBrittany Zuriyah Shatz, PT, DPT  Acute Rehabilitation Services  Pager: [phone] Office: [phone]    Lehman PromBrittany S Layni Kreamer 07/09/2018, 4:25 PM

## 2018-07-09 NOTE — Plan of Care (Signed)
Patient stable, discussed POC with patient, agreeable with plan, denies question/concerns at this time.  

## 2018-07-09 NOTE — TOC Transition Note (Signed)
Transition of Care [facility]) - CM/SW Discharge Note   Patient Details  Name: Alexandra Mckee MRN: [id number] Date of Birth: 1921-05-07  [facility] CM/SW Contact:  Baldemar Lenis, LCSW Phone Number: 07/09/2018, 3:53 PM   Clinical Narrative:   Nurse to call report to [phone]    Final next level of care: Memory Care Barriers to Discharge: No Barriers Identified   Patient Goals and CMS Choice        Discharge Placement              Patient chooses bed at: [facility] Patient to be transferred to facility by: [facility] Name of family member notified: Olegario Messier Patient and family notified of of transfer: 07/09/18  Discharge Plan and Services In-house Referral: Clinical Social Work                                   Social Determinants of Health (SDOH) Interventions     Readmission Risk Interventions No flowsheet data found.

## 2018-07-09 NOTE — Discharge Summary (Signed)
. Physician Discharge Summary  Alexandra Mckee ZOX:[id number] DOB: Aug 09, 1921 DOA: 07/03/2018  PCP: Tally Joe, MD  Admit date: 07/03/2018 Discharge date: 07/09/2018  Admitted From: ALF Disposition:  Discharged to ALF w/ HH  Recommendations for Outpatient Follow-up:  1. Follow up with PCP in 1-2 weeks 2. Please obtain BMP/CBC in one week     Discharge Condition: Stable  CODE STATUS: DNR   Brief/Interim Summary: Alexandra Mckee a 85 y.o.femalewith medical history significant ofhypertension, hyperlipidemia, previous stroke who presents with a right facial droop. She was last seen normal at the nursing home at 8:30 in the morning. Approximately of 11 AM, physical therapy came in to work with her and noticed that patient had a right facial droop and was more confused. EMS was called and patient was brought in as a code stroke. She denies any complaints on my examination. She denies any fevers, chills, chest pain, cough, shortness of breath, nausea, vomiting, diarrhea or abdominal pain  Discharge Diagnoses:  Principal Problem:   TIA (transient ischemic attack) Active Problems:   Essential hypertension  TIA - MRI Brain: IMPRESSION: 1. No acute intracranial abnormality. 2. Chronic ischemic microangiopathy and generalized volume loss. 3. Unchanged multifocal posterior cerebral artery short segment occlusion and/or severe stenosis. 4. Unchanged right MCA M2 segment and left ACA A2 segment severe stenoses. 5. Approximately 50% stenosis both proximal internal carotid arteries, unchanged. - she has persistent facial droop and some LUE weakness that is asymmetrical.  - SLP cognitioncomplete; needs SNF     - statin, DAPT per neuro - she will go back to her ALF w/ [facility] assistance; will accept on Monday  HTN - HCTZ; BP ok  Hypokalemia/Hypophosphatemia/Hypomagnesemia - replete     - K+ is still low despite IV replacement, she doesn't want to take much by mouth, I  will order K+ PO though. PO4- is improved     - Potassium and phos are resolved; Mg2+ is low. Can continue magOx outpt    Allergies as of 07/09/2018   No Known Allergies     Medication List    STOP taking these medications   cephALEXin 500 MG capsule Commonly known as:  KEFLEX     TAKE these medications   acetaminophen 325 MG tablet Commonly known as:  TYLENOL Take 650 mg by mouth every 6 (six) hours as needed for mild pain or headache.   aspirin 81 MG EC tablet Take 1 tablet (81 mg total) by mouth daily for 30 days. Start taking on:  Jul 10, 2018   atorvastatin 80 MG tablet Commonly known as:  LIPITOR Take 1 tablet (80 mg total) by mouth daily at 6 PM. What changed:  when to take this   clopidogrel 75 MG tablet Commonly known as:  PLAVIX Take 1 tablet (75 mg total) by mouth daily.   hydrochlorothiazide 25 MG tablet Commonly known as:  HYDRODIURIL Take 25 mg by mouth daily.   magnesium oxide 400 MG tablet Commonly known as:  MAG-OX Take 1 tablet (400 mg total) by mouth daily for 7 days.   mineral oil-hydrophilic petrolatum ointment Apply 1 application topically 2 (two) times daily as needed for dry skin.   mirtazapine 15 MG tablet Commonly known as:  REMERON Take 15 mg by mouth at bedtime.   potassium chloride 10 MEQ tablet Commonly known as:  K-DUR Take 1 tablet (10 mEq total) by mouth 2 (two) times daily.       No Known Allergies  Consultations:  Neurology  Procedures/Studies: Mr [name] Neck W Wo Contrast  Result Date: 07/04/2018 CLINICAL DATA:  Right facial droop EXAM: MR HEAD WITHOUT CONTRAST MR CIRCLE OF WILLIS WITHOUT CONTRAST MRA OF THE NECK WITHOUT AND WITH CONTRAST TECHNIQUE: Multiplanar, multiecho pulse sequences of the brain, circle of willis and surrounding structures were obtained without intravenous contrast. Angiographic images of the neck were obtained using MRA technique without and with intravenous contrast. CONTRAST:  6 mL Gadavist  COMPARISON:  Head CT 07/03/2018 Brain MRI 06/01/2017 FINDINGS: MRI HEAD FINDINGS BRAIN: The midline structures are normal. There is no acute infarct, acute hemorrhage or mass. Diffuse confluent hyperintense T2-weighted signal within the periventricular, deep and juxtacortical white matter, most commonly due to chronic ischemic microangiopathy. Generalized atrophy without lobar predilection. Blood-sensitive sequences show no chronic microhemorrhage or superficial siderosis. SKULL AND UPPER CERVICAL SPINE: The visualized skull base, calvarium, upper cervical spine and extracranial soft tissues are normal. SINUSES/ORBITS: No fluid levels or advanced mucosal thickening. No mastoid or middle ear effusion. The orbits are normal. MRA HEAD FINDINGS POSTERIOR CIRCULATION: --Basilar artery: Normal. --Posterior cerebral arteries: Unchanged occlusion near the origin of the right PCA. Multifocal severe stenosis or short segment occlusion of the left PCA, most evident at the proximal P2 segment. --Superior cerebellar arteries: Normal. --Inferior cerebellar arteries: Normal anterior and posterior inferior cerebellar arteries. ANTERIOR CIRCULATION: --Intracranial internal carotid arteries: Normal. --Anterior cerebral arteries: Both A1 segments are present. Patent anterior communicating artery. Short segment severe stenosis or occlusion of the distal left A2 segment, unchanged. --Middle cerebral arteries: Severe stenosis at the origin of the right M2 segment superior division, unchanged. --Posterior communicating arteries: Diminutive or absent bilaterally. MRA NECK FINDINGS Aortic arch: Normal 3 vessel aortic branching pattern. The visualized subclavian arteries are normal. Right carotid system: Approximately 50% stenosis of the proximal right ICA. Left carotid system: Approximately 50% stenosis of the proximal left ICA. Vertebral arteries: Right dominant. Vertebral artery origins are normal. Vertebral arteries are normal in course  and caliber to the vertebrobasilar confluence without stenosis or evidence of dissection. IMPRESSION: 1. No acute intracranial abnormality. 2. Chronic ischemic microangiopathy and generalized volume loss. 3. Unchanged multifocal posterior cerebral artery short segment occlusion and/or severe stenosis. 4. Unchanged right MCA M2 segment and left ACA A2 segment severe stenoses. 5. Approximately 50% stenosis both proximal internal carotid arteries, unchanged. Electronically Signed   By: Deatra Robinson M.D.   On: 07/04/2018 00:14   Mr Brain Wo Contrast  Result Date: 07/04/2018 CLINICAL DATA:  Right facial droop EXAM: MR HEAD WITHOUT CONTRAST MR CIRCLE OF WILLIS WITHOUT CONTRAST MRA OF THE NECK WITHOUT AND WITH CONTRAST TECHNIQUE: Multiplanar, multiecho pulse sequences of the brain, circle of willis and surrounding structures were obtained without intravenous contrast. Angiographic images of the neck were obtained using MRA technique without and with intravenous contrast. CONTRAST:  6 mL Gadavist COMPARISON:  Head CT 07/03/2018 Brain MRI 06/01/2017 FINDINGS: MRI HEAD FINDINGS BRAIN: The midline structures are normal. There is no acute infarct, acute hemorrhage or mass. Diffuse confluent hyperintense T2-weighted signal within the periventricular, deep and juxtacortical white matter, most commonly due to chronic ischemic microangiopathy. Generalized atrophy without lobar predilection. Blood-sensitive sequences show no chronic microhemorrhage or superficial siderosis. SKULL AND UPPER CERVICAL SPINE: The visualized skull base, calvarium, upper cervical spine and extracranial soft tissues are normal. SINUSES/ORBITS: No fluid levels or advanced mucosal thickening. No mastoid or middle ear effusion. The orbits are normal. MRA HEAD FINDINGS POSTERIOR CIRCULATION: --Basilar artery: Normal. --Posterior cerebral arteries: Unchanged occlusion near the origin of  the right PCA. Multifocal severe stenosis or short segment occlusion  of the left PCA, most evident at the proximal P2 segment. --Superior cerebellar arteries: Normal. --Inferior cerebellar arteries: Normal anterior and posterior inferior cerebellar arteries. ANTERIOR CIRCULATION: --Intracranial internal carotid arteries: Normal. --Anterior cerebral arteries: Both A1 segments are present. Patent anterior communicating artery. Short segment severe stenosis or occlusion of the distal left A2 segment, unchanged. --Middle cerebral arteries: Severe stenosis at the origin of the right M2 segment superior division, unchanged. --Posterior communicating arteries: Diminutive or absent bilaterally. MRA NECK FINDINGS Aortic arch: Normal 3 vessel aortic branching pattern. The visualized subclavian arteries are normal. Right carotid system: Approximately 50% stenosis of the proximal right ICA. Left carotid system: Approximately 50% stenosis of the proximal left ICA. Vertebral arteries: Right dominant. Vertebral artery origins are normal. Vertebral arteries are normal in course and caliber to the vertebrobasilar confluence without stenosis or evidence of dissection. IMPRESSION: 1. No acute intracranial abnormality. 2. Chronic ischemic microangiopathy and generalized volume loss. 3. Unchanged multifocal posterior cerebral artery short segment occlusion and/or severe stenosis. 4. Unchanged right MCA M2 segment and left ACA A2 segment severe stenoses. 5. Approximately 50% stenosis both proximal internal carotid arteries, unchanged. Electronically Signed   By: Deatra RobinsonKevin  Herman M.D.   On: 07/04/2018 00:14   Dg Chest Portable 1 View  Result Date: 07/03/2018 CLINICAL DATA:  Weakness EXAM: PORTABLE CHEST 1 VIEW COMPARISON:  06/01/2017 FINDINGS: Cardiac shadows within normal limits. Mitral valve calcifications are noted. The lungs are clear bilaterally. No acute bony abnormality is seen. IMPRESSION: No acute abnormality noted. Electronically Signed   By: Alcide CleverMark  Lukens M.D.   On: 07/03/2018 13:01   Mr Maxine GlennMra  Head Wo Contrast  Result Date: 07/04/2018 CLINICAL DATA:  Right facial droop EXAM: MR HEAD WITHOUT CONTRAST MR CIRCLE OF WILLIS WITHOUT CONTRAST MRA OF THE NECK WITHOUT AND WITH CONTRAST TECHNIQUE: Multiplanar, multiecho pulse sequences of the brain, circle of willis and surrounding structures were obtained without intravenous contrast. Angiographic images of the neck were obtained using MRA technique without and with intravenous contrast. CONTRAST:  6 mL Gadavist COMPARISON:  Head CT 07/03/2018 Brain MRI 06/01/2017 FINDINGS: MRI HEAD FINDINGS BRAIN: The midline structures are normal. There is no acute infarct, acute hemorrhage or mass. Diffuse confluent hyperintense T2-weighted signal within the periventricular, deep and juxtacortical white matter, most commonly due to chronic ischemic microangiopathy. Generalized atrophy without lobar predilection. Blood-sensitive sequences show no chronic microhemorrhage or superficial siderosis. SKULL AND UPPER CERVICAL SPINE: The visualized skull base, calvarium, upper cervical spine and extracranial soft tissues are normal. SINUSES/ORBITS: No fluid levels or advanced mucosal thickening. No mastoid or middle ear effusion. The orbits are normal. MRA HEAD FINDINGS POSTERIOR CIRCULATION: --Basilar artery: Normal. --Posterior cerebral arteries: Unchanged occlusion near the origin of the right PCA. Multifocal severe stenosis or short segment occlusion of the left PCA, most evident at the proximal P2 segment. --Superior cerebellar arteries: Normal. --Inferior cerebellar arteries: Normal anterior and posterior inferior cerebellar arteries. ANTERIOR CIRCULATION: --Intracranial internal carotid arteries: Normal. --Anterior cerebral arteries: Both A1 segments are present. Patent anterior communicating artery. Short segment severe stenosis or occlusion of the distal left A2 segment, unchanged. --Middle cerebral arteries: Severe stenosis at the origin of the right M2 segment superior  division, unchanged. --Posterior communicating arteries: Diminutive or absent bilaterally. MRA NECK FINDINGS Aortic arch: Normal 3 vessel aortic branching pattern. The visualized subclavian arteries are normal. Right carotid system: Approximately 50% stenosis of the proximal right ICA. Left carotid system: Approximately 50% stenosis  of the proximal left ICA. Vertebral arteries: Right dominant. Vertebral artery origins are normal. Vertebral arteries are normal in course and caliber to the vertebrobasilar confluence without stenosis or evidence of dissection. IMPRESSION: 1. No acute intracranial abnormality. 2. Chronic ischemic microangiopathy and generalized volume loss. 3. Unchanged multifocal posterior cerebral artery short segment occlusion and/or severe stenosis. 4. Unchanged right MCA M2 segment and left ACA A2 segment severe stenoses. 5. Approximately 50% stenosis both proximal internal carotid arteries, unchanged. Electronically Signed   By: Deatra Robinson M.D.   On: 07/04/2018 00:14   Ct Head Code Stroke Wo Contrast  Result Date: 07/03/2018 CLINICAL DATA:  Code stroke.  Left-sided weakness EXAM: CT HEAD WITHOUT CONTRAST TECHNIQUE: Contiguous axial images were obtained from the base of the skull through the vertex without intravenous contrast. COMPARISON:  MRI head 06/01/2017, CT 06/01/2017 FINDINGS: Brain: Moderate atrophy. Moderate chronic microvascular ischemic change in the white matter. Hypodensity right cerebellum consistent with chronic subarachnoid space. Negative for acute infarct, hemorrhage, or mass. Vascular: Negative for hyperdense vessel. Atherosclerotic calcification Skull: Negative Sinuses/Orbits: Mild mucosal edema paranasal sinuses. Right cataract surgery. Other: None ASPECTS ([place]ber[place] Stroke Program Early CT Score) - Ganglionic level infarction (caudate, lentiform nuclei, internal capsule, insula, M1-M3 cortex): 7 - Supraganglionic infarction (M4-M6 cortex): 3 Total score (0-10 with 10  being normal): 10 IMPRESSION: 1. No acute abnormality 2. ASPECTS is 10 3. Moderate atrophy and moderate chronic microvascular ischemic change 4. These results were called by telephone at the time of interpretation on 07/03/2018 at 12:40 pm to Dr. Amada Jupiter , who verbally acknowledged these results. Electronically Signed   By: Marlan Palau M.D.   On: 07/03/2018 12:41    (Echo, Carotid, EGD, Colonoscopy, ERCP)    Subjective: "No one knows what they are doing."  Discharge Exam: Vitals:   07/09/18 0938 07/09/18 1155  BP: (!) 140/115 (!) 165/75  Pulse: 79 81  Resp: 18 18  Temp: 98.8 F (37.1 C) 98.8 F (37.1 C)  SpO2: 98% 97%   Vitals:   07/09/18 0001 07/09/18 0447 07/09/18 0938 07/09/18 1155  BP: (!) 154/65 (!) 163/75 (!) 140/115 (!) 165/75  Pulse: 87 82 79 81  Resp:   18 18  Temp: 99.3 F (37.4 C) 99 F (37.2 C) 98.8 F (37.1 C) 98.8 F (37.1 C)  TempSrc: Oral Oral Oral Oral  SpO2: 95% 97% 98% 97%  Weight:        General:85 y.o.femaleresting in bed in NAD Cardiovascular: RRR, +S1, S2,2/6 SEM,no g/r, equal pulses throughout Respiratory: CTABL, no w/r/r, normal WOB GI: BS+, NDNT, no masses noted, no organomegaly noted MSK: No e/c/c Skin: No rashes, bruises, ulcerations noted Neuro:follows commands, persistent facial droop, LUE weakness, and BLE weakness    The results of significant diagnostics from this hospitalization (including imaging, microbiology, ancillary and laboratory) are listed below for reference.     Microbiology: Recent Results (from the past 240 hour(s))  SARS Coronavirus 2 (CEPHEID - Performed in [facility] hospital lab), Hosp Order     Status: None   Collection Time: 07/03/18 12:49 PM  Result Value Ref Range Status   SARS Coronavirus 2 NEGATIVE NEGATIVE Final    Comment: (NOTE) If result is NEGATIVE SARS-CoV-2 target nucleic acids are NOT DETECTED. The SARS-CoV-2 RNA is generally detectable in upper and lower  respiratory specimens  during the acute phase of infection. The lowest  concentration of SARS-CoV-2 viral copies this assay can detect is 250  copies / mL. A negative result does not  preclude SARS-CoV-2 infection  and should not be used as the sole basis for treatment or other  patient management decisions.  A negative result may occur with  improper specimen collection / handling, submission of specimen other  than nasopharyngeal swab, presence of viral mutation(s) within the  areas targeted by this assay, and inadequate number of viral copies  (<250 copies / mL). A negative result must be combined with clinical  observations, patient history, and epidemiological information. If result is POSITIVE SARS-CoV-2 target nucleic acids are DETECTED. The SARS-CoV-2 RNA is generally detectable in upper and lower  respiratory specimens dur ing the acute phase of infection.  Positive  results are indicative of active infection with SARS-CoV-2.  Clinical  correlation with patient history and other diagnostic information is  necessary to determine patient infection status.  Positive results do  not rule out bacterial infection or co-infection with other viruses. If result is PRESUMPTIVE POSTIVE SARS-CoV-2 nucleic acids MAY BE PRESENT.   A presumptive positive result was obtained on the submitted specimen  and confirmed on repeat testing.  While 2019 novel coronavirus  (SARS-CoV-2) nucleic acids may be present in the submitted sample  additional confirmatory testing may be necessary for epidemiological  and / or clinical management purposes  to differentiate between  SARS-CoV-2 and other Sarbecovirus currently known to infect humans.  If clinically indicated additional testing with an alternate test  methodology [phone]) is advised. The SARS-CoV-2 RNA is generally  detectable in upper and lower respiratory sp ecimens during the acute  phase of infection. The expected result is Negative. Fact Sheet for Patients:   BoilerBrush.com.cy Fact Sheet for Healthcare Providers: https://pope.com/ This test is not yet approved or cleared by the [place] FDA and has been authorized for detection and/or diagnosis of SARS-CoV-2 by FDA under an Emergency Use Authorization (EUA).  This EUA will remain in effect (meaning this test can be used) for the duration of the COVID-19 declaration under Section 564(b)(1) of the Act, 21 U.S.C. section 360bbb-3(b)(1), unless the authorization is terminated or revoked sooner. Performed at [facility], [address], [place], [place] [address]      Labs: BNP (last 3 results) No results for input(s): BNP in the last 8760 hours. Basic Metabolic Panel: Recent Labs  Lab 07/06/18 0350 07/06/18 1450 07/07/18 0402 07/08/18 1219 07/09/18 1132  NA 138 141 138 139 137  K 2.9* 2.9* 3.0* 2.7* 3.5  CL 104 107 105 105 102  CO2 26 24 21* 23 25  GLUCOSE 169* 123* 138* 140* 139*  BUN 5* <5*  CREATININE 0.73 0.55 0.61 0.54 0.55  CALCIUM 8.6* 8.7* 8.4* 8.4* 8.6*  MG  --  1.7 1.6*  --  1.4*  PHOS 1.9* 1.8* 2.4* 2.3* 2.5   Liver Function Tests: Recent Labs  Lab 07/03/18 1221 07/06/18 0350 07/06/18 1450 07/07/18 0402 07/08/18 1219 07/09/18 1132  AST 53*  --   --   --   --   --   ALT 32  --   --   --   --   --   ALKPHOS 106  --   --   --   --   --   BILITOT 0.7  --   --   --   --   --   PROT 7.9  --   --   --   --   --   ALBUMIN 3.2* 2.5* 2.4* 2.3* 2.1* 2.2*   No results  for input(s): LIPASE, AMYLASE in the last 168 hours. No results for input(s): AMMONIA in the last 168 hours. CBC: Recent Labs  Lab 07/03/18 1221 07/03/18 1241 07/04/18 0356 07/06/18 0350 07/07/18 0402  WBC 8.5  --  7.8 8.8 6.7  NEUTROABS 5.4  --   --  6.1 3.8  HGB 12.9 12.6 12.8 11.3* 10.8*  HCT 40.6 37.0 39.0 35.0* 33.7*  MCV 93.1  --  90.7 90.9 91.8  PLT 254  --  228 199 181   Cardiac Enzymes: No results for input(s):  CKTOTAL, CKMB, CKMBINDEX, TROPONINI in the last 168 hours. BNP: Invalid input(s): POCBNP CBG: Recent Labs  Lab 07/03/18 1221 07/03/18 1223 07/09/18 1151  GLUCAP 107* 100* 138*   D-Dimer No results for input(s): DDIMER in the last 72 hours. Hgb A1c No results for input(s): HGBA1C in the last 72 hours. Lipid Profile No results for input(s): CHOL, HDL, LDLCALC, TRIG, CHOLHDL, LDLDIRECT in the last 72 hours. Thyroid function studies No results for input(s): TSH, T4TOTAL, T3FREE, THYROIDAB in the last 72 hours.  Invalid input(s): FREET3 Anemia work up No results for input(s): VITAMINB12, FOLATE, FERRITIN, TIBC, IRON, RETICCTPCT in the last 72 hours. Urinalysis    Component Value Date/Time   COLORURINE YELLOW 07/05/2018 0448   APPEARANCEUR HAZY (A) 07/05/2018 0448   LABSPEC 1.024 07/05/2018 0448   PHURINE 5.0 07/05/2018 0448   GLUCOSEU NEGATIVE 07/05/2018 0448   HGBUR MODERATE (A) 07/05/2018 0448   BILIRUBINUR NEGATIVE 07/05/2018 0448   KETONESUR 5 (A) 07/05/2018 0448   PROTEINUR 30 (A) 07/05/2018 0448   UROBILINOGEN 1.0 12/23/2013 1438   NITRITE POSITIVE (A) 07/05/2018 0448   LEUKOCYTESUR NEGATIVE 07/05/2018 0448   Sepsis Labs Invalid input(s): PROCALCITONIN,  WBC,  LACTICIDVEN Microbiology Recent Results (from the past 240 hour(s))  SARS Coronavirus 2 (CEPHEID - Performed in [facility] hospital lab), Hosp Order     Status: None   Collection Time: 07/03/18 12:49 PM  Result Value Ref Range Status   SARS Coronavirus 2 NEGATIVE NEGATIVE Final    Comment: (NOTE) If result is NEGATIVE SARS-CoV-2 target nucleic acids are NOT DETECTED. The SARS-CoV-2 RNA is generally detectable in upper and lower  respiratory specimens during the acute phase of infection. The lowest  concentration of SARS-CoV-2 viral copies this assay can detect is 250  copies / mL. A negative result does not preclude SARS-CoV-2 infection  and should not be used as the sole basis for treatment or other   patient management decisions.  A negative result may occur with  improper specimen collection / handling, submission of specimen other  than nasopharyngeal swab, presence of viral mutation(s) within the  areas targeted by this assay, and inadequate number of viral copies  (<250 copies / mL). A negative result must be combined with clinical  observations, patient history, and epidemiological information. If result is POSITIVE SARS-CoV-2 target nucleic acids are DETECTED. The SARS-CoV-2 RNA is generally detectable in upper and lower  respiratory specimens dur ing the acute phase of infection.  Positive  results are indicative of active infection with SARS-CoV-2.  Clinical  correlation with patient history and other diagnostic information is  necessary to determine patient infection status.  Positive results do  not rule out bacterial infection or co-infection with other viruses. If result is PRESUMPTIVE POSTIVE SARS-CoV-2 nucleic acids MAY BE PRESENT.   A presumptive positive result was obtained on the submitted specimen  and confirmed on repeat testing.  While 2019 novel coronavirus  (SARS-CoV-2) nucleic acids  may be present in the submitted sample  additional confirmatory testing may be necessary for epidemiological  and / or clinical management purposes  to differentiate between  SARS-CoV-2 and other Sarbecovirus currently known to infect humans.  If clinically indicated additional testing with an alternate test  methodology [phone]) is advised. The SARS-CoV-2 RNA is generally  detectable in upper and lower respiratory sp ecimens during the acute  phase of infection. The expected result is Negative. Fact Sheet for Patients:  BoilerBrush.com.cy Fact Sheet for Healthcare Providers: https://pope.com/ This test is not yet approved or cleared by the [place] FDA and has been authorized for detection and/or diagnosis of SARS-CoV-2  by FDA under an Emergency Use Authorization (EUA).  This EUA will remain in effect (meaning this test can be used) for the duration of the COVID-19 declaration under Section 564(b)(1) of the Act, 21 U.S.C. section 360bbb-3(b)(1), unless the authorization is terminated or revoked sooner. Performed at [facility], [address], [place], [place] [address]      Time coordinating discharge: 35 minutes spent in the coordination of care today.  SIGNED:   Teddy Spike, DO  [facility] 07/09/2018, 2:27 PM Pager   If 7PM-7AM, please contact night-coverage [email or web address] Password [email or web address]

## 2018-07-09 NOTE — [place] FL2 (Signed)
[place] MEDICAID FL2 LEVEL OF CARE SCREENING TOOL     IDENTIFICATION  Patient Name: Alexandra CoeLois W Hollon Birthdate: [birth year] Sex: female Admission Date (Current Location): 07/03/2018  [facility] [place]   Facility and Address:  [facility] [place], [address], [place], [place] [address]      Provider Number: [id number]  Attending Physician Name and Address:  Teddy SpikeKyle, Tyrone A, DO  Relative Name and Phone Number:  Ivin PootCharles Biswell, Son, [phone]     Current Level of Care: Hospital Recommended Level of Care: Assisted Living Facility Prior Approval Number:    Date Approved/Denied:   PASRR Number:    Discharge Plan:      Current Diagnoses: Patient Active Problem List   Diagnosis Date Noted  . TIA (transient ischemic attack) 07/03/2018  . Essential hypertension 07/03/2018  . Stroke (HCC) 06/01/2017  . Memory loss 08/28/2013    Orientation RESPIRATION BLADDER Height & Weight     Self  Normal Incontinent, External catheter Weight: 130 lb 8.2 oz (59.2 kg) Height:     BEHAVIORAL SYMPTOMS/MOOD NEUROLOGICAL BOWEL NUTRITION STATUS      Continent Diet(Heart Healthy diet, thin liquids)  AMBULATORY STATUS COMMUNICATION OF NEEDS Skin   Limited Assist(Limited assistance) Verbally Other (Comment)(Cellulitis )                       Personal Care Assistance Level of Assistance  Bathing, Feeding, Dressing, Total care Bathing Assistance: Maximum assistance(min upper/max 2+ lower) Feeding assistance: Independent(May need assistance setting up) Dressing Assistance: Limited assistance(mod upper/total +2 lower) Total Care Assistance: Limited assistance(Limited assistance)   Functional Limitations Info  Sight, Hearing, Speech Sight Info: Adequate Hearing Info: Adequate Speech Info: Adequate    SPECIAL CARE FACTORS FREQUENCY  PT (By licensed PT), OT (By licensed OT), Speech therapy     PT Frequency: 3x/wk OT Frequency: 3x/wk     Speech  Therapy Frequency: 2x/wk      Contractures Contractures Info: Not present    Additional Factors Info  Code Status, Allergies Code Status Info: DNR Allergies Info: No Known Allergies           Current Medications (07/09/2018):  This is the current hospital active medication list Current Facility-Administered Medications  Medication Dose Route Frequency Provider Last Rate Last Dose  . 0.9 %  sodium chloride infusion  250 mL Intravenous PRN Noralee Stainhoi, Jennifer, DO      . acetaminophen (TYLENOL) tablet 650 mg  650 mg Oral Q6H PRN Noralee Stainhoi, Jennifer, DO       Or  . acetaminophen (TYLENOL) suppository 650 mg  650 mg Rectal Q6H PRN Noralee Stainhoi, Jennifer, DO      . aspirin EC tablet 81 mg  81 mg Oral Daily Micki RileySethi, Pramod S, MD   81 mg at 07/09/18 1024  . atorvastatin (LIPITOR) tablet 80 mg  80 mg Oral Q2000 Noralee Stainhoi, Jennifer, DO   80 mg at 07/08/18 2112  . clopidogrel (PLAVIX) tablet 75 mg  75 mg Oral Daily Noralee Stainhoi, Jennifer, DO   75 mg at 07/09/18 1024  . dextrose 5 %-0.9 % sodium chloride infusion   Intravenous Continuous Kyle, Tyrone A, DO 125 mL/hr at 07/09/18 1027    . enoxaparin (LOVENOX) injection 40 mg  40 mg Subcutaneous Q24H Noralee Stainhoi, Jennifer, DO   40 mg at 07/08/18 1852  . feeding supplement (ENSURE ENLIVE) (ENSURE ENLIVE) liquid 237 mL  237 mL Oral BID BM Kyle, Tyrone A, DO   237  mL at 07/09/18 1025  . hydrALAZINE (APRESOLINE) injection 5 mg  5 mg Intravenous Q6H PRN Ronaldo Miyamoto, Tyrone A, DO      . Melatonin TABS 6 mg  6 mg Oral QHS Scarlett Presto, RPH   6 mg at 07/08/18 2112  . mirtazapine (REMERON) tablet 15 mg  15 mg Oral QHS Noralee Stain, DO   15 mg at 07/08/18 2113  . ondansetron (ZOFRAN) tablet 4 mg  4 mg Oral Q6H PRN Noralee Stain, DO       Or  . ondansetron [facility]) injection 4 mg  4 mg Intravenous Q6H PRN Noralee Stain, DO      . potassium chloride (KLOR-CON) packet 20 mEq  20 mEq Oral BID Ronaldo Miyamoto, Tyrone A, DO   20 mEq at 07/09/18 1024  . senna-docusate (Senokot-S) tablet 1 tablet  1 tablet Oral  QHS PRN Noralee Stain, DO   1 tablet at 07/04/18 2043  . sodium chloride flush (NS) 0.9 % injection 3 mL  3 mL Intravenous Once Noralee Stain, DO      . sodium chloride flush (NS) 0.9 % injection 3 mL  3 mL Intravenous Q12H Noralee Stain, DO   3 mL at 07/09/18 1024  . sodium chloride flush (NS) 0.9 % injection 3 mL  3 mL Intravenous PRN Noralee Stain, DO         Discharge Medications: STOP taking these medications   cephALEXin 500 MG capsule Commonly known as:  KEFLEX     TAKE these medications   acetaminophen 325 MG tablet Commonly known as:  TYLENOL Take 650 mg by mouth every 6 (six) hours as needed for mild pain or headache.   aspirin 81 MG EC tablet Take 1 tablet (81 mg total) by mouth daily for 30 days. Start taking on:  Jul 10, 2018   atorvastatin 80 MG tablet Commonly known as:  LIPITOR Take 1 tablet (80 mg total) by mouth daily at 6 PM. What changed:  when to take this   clopidogrel 75 MG tablet Commonly known as:  PLAVIX Take 1 tablet (75 mg total) by mouth daily.   hydrochlorothiazide 25 MG tablet Commonly known as:  HYDRODIURIL Take 25 mg by mouth daily.   magnesium oxide 400 MG tablet Commonly known as:  MAG-OX Take 1 tablet (400 mg total) by mouth daily for 7 days.   mineral oil-hydrophilic petrolatum ointment Apply 1 application topically 2 (two) times daily as needed for dry skin.   mirtazapine 15 MG tablet Commonly known as:  REMERON Take 15 mg by mouth at bedtime.   potassium chloride 10 MEQ tablet Commonly known as:  K-DUR Take 1 tablet (10 mEq total) by mouth 2 (two) times daily.     Relevant Imaging Results:  Relevant Lab Results:   Additional Information SSN: [id number]  Baldemar Lenis, LCSW

## 2018-11-22 DEATH — deceased
